# Patient Record
Sex: Male | Born: 1965
Health system: Southern US, Community
[De-identification: ages and names within clinical notes are randomized; demographics above are authoritative.]

## PROBLEM LIST (undated history)

## (undated) DIAGNOSIS — M25572 Pain in left ankle and joints of left foot: Secondary | ICD-10-CM

## (undated) DIAGNOSIS — M1711 Unilateral primary osteoarthritis, right knee: Secondary | ICD-10-CM

## (undated) DIAGNOSIS — G8929 Other chronic pain: Secondary | ICD-10-CM

## (undated) DIAGNOSIS — E7800 Pure hypercholesterolemia, unspecified: Secondary | ICD-10-CM

## (undated) DIAGNOSIS — I10 Essential (primary) hypertension: Secondary | ICD-10-CM

## (undated) DIAGNOSIS — M79672 Pain in left foot: Secondary | ICD-10-CM

## (undated) DIAGNOSIS — Z8042 Family history of malignant neoplasm of prostate: Secondary | ICD-10-CM

## (undated) HISTORY — DX: Pure hypercholesterolemia, unspecified: E78.00

## (undated) HISTORY — DX: Unilateral primary osteoarthritis, right knee: M17.11

## (undated) HISTORY — DX: Morbid (severe) obesity due to excess calories: E66.01

## (undated) HISTORY — DX: Pain in left ankle and joints of left foot: M25.572

## (undated) HISTORY — DX: Essential (primary) hypertension: I10

## (undated) HISTORY — DX: Pain in left foot: M79.672

## (undated) HISTORY — DX: Other chronic pain: G89.29

## (undated) HISTORY — DX: Family history of malignant neoplasm of prostate: Z80.42

---

## 2011-04-11 ENCOUNTER — Ambulatory Visit
Admission: RE | Admit: 2011-04-11 | Discharge: 2011-04-11 | Disposition: A | Discharge status: Home / Self Care | Payer: 59 | Source: Ambulatory Visit | Attending: Orthopedic Surgery | Admitting: Orthopedic Surgery

## 2011-04-11 ENCOUNTER — Other Ambulatory Visit: Payer: Self-pay | Admitting: Orthopedic Surgery

## 2011-04-11 DIAGNOSIS — M79672 Pain in left foot: Secondary | ICD-10-CM

## 2013-07-17 ENCOUNTER — Ambulatory Visit
Admission: RE | Admit: 2013-07-17 | Discharge: 2013-07-17 | Disposition: A | Discharge status: Home / Self Care | Payer: BC Managed Care – PPO | Source: Ambulatory Visit | Attending: Family Medicine | Admitting: Family Medicine

## 2013-07-17 ENCOUNTER — Other Ambulatory Visit: Payer: Self-pay | Admitting: Family Medicine

## 2013-07-17 DIAGNOSIS — M25579 Pain in unspecified ankle and joints of unspecified foot: Secondary | ICD-10-CM

## 2014-11-28 HISTORY — PX: COLONOSCOPY: SHX174

## 2015-05-28 HISTORY — PX: COLONOSCOPY: SHX174

## 2017-11-28 DIAGNOSIS — G8929 Other chronic pain: Secondary | ICD-10-CM

## 2017-11-28 HISTORY — DX: Other chronic pain: G89.29

## 2018-12-06 DIAGNOSIS — R030 Elevated blood-pressure reading, without diagnosis of hypertension: Secondary | ICD-10-CM | POA: Diagnosis not present

## 2018-12-06 DIAGNOSIS — R0602 Shortness of breath: Secondary | ICD-10-CM | POA: Diagnosis not present

## 2018-12-06 DIAGNOSIS — R635 Abnormal weight gain: Secondary | ICD-10-CM | POA: Diagnosis not present

## 2018-12-06 DIAGNOSIS — R5383 Other fatigue: Secondary | ICD-10-CM | POA: Diagnosis not present

## 2018-12-13 ENCOUNTER — Ambulatory Visit (INDEPENDENT_AMBULATORY_CARE_PROVIDER_SITE_OTHER): Payer: BLUE CROSS/BLUE SHIELD | Admitting: Family Medicine

## 2018-12-13 ENCOUNTER — Encounter: Payer: Self-pay | Admitting: Family Medicine

## 2018-12-13 VITALS — BP 147/85 | HR 78 | Temp 97.8°F | Resp 16 | Ht 71.0 in | Wt 317.0 lb

## 2018-12-13 DIAGNOSIS — M25561 Pain in right knee: Secondary | ICD-10-CM | POA: Diagnosis not present

## 2018-12-13 DIAGNOSIS — M79672 Pain in left foot: Secondary | ICD-10-CM | POA: Diagnosis not present

## 2018-12-13 DIAGNOSIS — M25572 Pain in left ankle and joints of left foot: Secondary | ICD-10-CM

## 2018-12-13 DIAGNOSIS — G8929 Other chronic pain: Secondary | ICD-10-CM

## 2018-12-13 MED ORDER — CELECOXIB 200 MG PO CAPS: 200.0000 mg | ORAL_CAPSULE | Freq: Two times a day (BID) | ORAL | 1 refills | Status: DC

## 2018-12-13 MED ORDER — PANTOPRAZOLE SODIUM 40 MG PO TBEC: 40.0000 mg | DELAYED_RELEASE_TABLET | Freq: Every day | ORAL | 1 refills | Status: DC

## 2018-12-13 NOTE — Patient Instructions (Signed)
Stop ibuprofen/advil (don't take any over the counter medication for pain).

## 2018-12-13 NOTE — Progress Notes (Signed)
Office Note 12/13/2018  CC:  Chief Complaint  Patient presents with  . Establish Care    Previous PCP [facility]  . Obesity    Pt is fasting   . Joint Pain    Right knee, left ankle     HPI:  Cristian Harris is a 53 y.o. male who is here to establish care and wants to discuss obesity as well as joint pain in R knee and left ankle. Patient's most recent primary MD: see above.  Last visit with them was about 4 yrs ago. Old records were not available for review prior to or during today's visit.  No CPE in 4 yrs or so.  Has pain in L foot on plantar surface from heel across the arch x "years".  Also, left ankle started hurting a few years ago as well, came on after his foot pain. Was walking a lot at the time.  R knee then began to hurt, pt suspicious that he was altering his gait to alleviate L ankle/foot pain/pressure.   Has had x-rays on L ankle and R knee in the past but he doesn't recall where and pt is not sure what they showed exactly.  No hx of any significant injury to knees, ankles, or feet. Has been taking ibup 600 mg multiple times a day for a couple years. All of the pain has prohibited any exercise and he is concerned about his significant wt gain over the last several years. Wants to get more active.   Past Medical History:  Diagnosis Date  . Borderline hyperlipidemia   . Family history of prostate cancer in father   . Morbid obesity (HCC)     Past Surgical History:  Procedure Laterality Date  . COLONOSCOPY  11/28/2014   [facility] per pt recall 5 years     Family History  Problem Relation Age of Onset  . Diabetes Mother   . Hyperlipidemia Mother   . Hypertension Mother   . Kidney disease Mother   . Arthritis Father   . Prostate cancer Father   . Thyroid disease Sister     Social History   Socioeconomic History  . Marital status: Legally Separated    Spouse name: Not on file  . Number of children: Not on file  . Years of education: Not  on file  . Highest education level: Not on file  Occupational History  . Not on file  Social Needs  . Financial resource strain: Not on file  . Food insecurity:    Worry: Not on file    Inability: Not on file  . Transportation needs:    Medical: Not on file    Non-medical: Not on file  Tobacco Use  . Smoking status: Light Tobacco Smoker    Types: Cigars  . Smokeless tobacco: Never Used  Substance and Sexual Activity  . Alcohol use: Yes    Comment: 1-2 monthly   . Drug use: Never  . Sexual activity: Not on file  Lifestyle  . Physical activity:    Days per week: Not on file    Minutes per session: Not on file  . Stress: Not on file  Relationships  . Social connections:    Talks on phone: Not on file    Gets together: Not on file    Attends religious service: Not on file    Active member of club or organization: Not on file    Attends meetings of clubs or organizations: Not  on file    Relationship status: Not on file  . Intimate partner violence:    Fear of current or ex partner: Not on file    Emotionally abused: Not on file    Physically abused: Not on file    Forced sexual activity: Not on file  Other Topics Concern  . Not on file  Social History Narrative   Married, 2 sons.   Educ: BS at [employer].   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   Tob:5 pack-yr hx, quit 1991.   Alc: rare.      Was in the [employer].  [place].   Got out 2002.    Outpatient Encounter Medications as of 12/13/2018  Medication Sig  . Cholecalciferol (DIALYVITE VITAMIN D 5000 PO) Take 2 capsules by mouth daily.  Marland Kitchen ibuprofen (ADVIL) 200 MG tablet Take 600 mg by mouth 2 (two) times daily.  . Multiple Vitamin (MULTIVITAMIN) tablet Take 1 tablet by mouth daily.  . celecoxib (CELEBREX) 200 MG capsule Take 1 capsule (200 mg total) by mouth 2 (two) times daily.  . pantoprazole (PROTONIX) 40 MG tablet Take 1 tablet (40 mg total) by mouth daily.   No facility-administered  encounter medications on file as of 12/13/2018.     No Known Allergies  ROS Review of Systems  Constitutional: Negative for fatigue and fever.  HENT: Negative for congestion and sore throat.   Eyes: Negative for visual disturbance.  Respiratory: Negative for cough and shortness of breath.   Cardiovascular: Negative for chest pain and leg swelling.  Gastrointestinal: Negative for abdominal pain, blood in stool and nausea.  Genitourinary: Negative for dysuria.  Musculoskeletal: Negative for back pain, joint swelling and myalgias.  Skin: Negative for rash.  Neurological: Negative for weakness and headaches.  Hematological: Negative for adenopathy.  Psychiatric/Behavioral: Negative for dysphoric mood. The patient is not nervous/anxious.     PE; Blood pressure (!) 147/85, pulse 78, temperature 97.8 F (36.6 C), temperature source Oral, resp. rate 16, height 5\' 11"  (1.803 m), weight (!) 317 lb (143.8 kg), SpO2 96 %. Body mass index is 44.21 kg/m.  Gen: Alert, well appearing.  Patient is oriented to person, place, time, and situation. AFFECT: pleasant, lucid thought and speech. Left ankle without erythema, swelling, or warmth.  ROM intact.  Mild TTP inferior to medial malleolus. No instability.  He has flat feet bilat, R>L.  He has mild tenderness to palpation on plantar aspect of left foot in proximal tarsometatarsal region.  No heel tenderness, no metatarsal head tenderness.  DP and PT pulses 1+ bilat. Right knee with no swelling or effusion.  No erythema or warmth.  Mild TTP along medial joint line when sitting up and knee at 90 degrees, but McMurray's elicits no joint line pain or tenderness. No instability with lachman's or posterior drawer or varus/valgus stressing. No tendonous swelling or tenderness.  ROM of R knee fully intact.  Pertinent labs:  None today.  ASSESSMENT AND PLAN:   New pt: will obtain some records from prior PCP as well as record of his colonoscopy.  1) Left  plantar foot pain of unclear etiology, with mild medial L ankle pain and R knee pain. Exam pretty unremarkable. I chose to have him stop ibuprofen, start celebrex 200 mg bid and pantoprazole 40mg  qd. Will obtain xrays of R knee, L ankle, and L foot. Refer to sports medicine for further evaluation.  An After Visit Summary was printed and given to  the patient.  Follow up: CPE in 1 month  Signed:  Santiago BumpersPhil Evvie Behrmann, MD           12/13/2018

## 2018-12-17 ENCOUNTER — Encounter: Payer: Self-pay | Admitting: *Deleted

## 2018-12-17 ENCOUNTER — Telehealth: Payer: Self-pay | Admitting: *Deleted

## 2018-12-17 MED ORDER — MELOXICAM 15 MG PO TABS: 15.0000 mg | ORAL_TABLET | Freq: Every day | ORAL | 3 refills | Status: DC

## 2018-12-17 NOTE — Telephone Encounter (Signed)
Received a fax from pts pharmacy requesting PA for Celecoxib.   Started PA but was asked it pt had tried and failed any other NSAIDS. Dr. Milinda Cave reviewed the list and decided to change Rx to meloxicam 15mg  take 1 tab daily #30 w/ 3RF.  Rx sent.   Left message for pt to call back.

## 2018-12-17 NOTE — Telephone Encounter (Signed)
Pt advised and voiced understanding.   

## 2018-12-20 DIAGNOSIS — R635 Abnormal weight gain: Secondary | ICD-10-CM | POA: Diagnosis not present

## 2018-12-20 DIAGNOSIS — M79672 Pain in left foot: Secondary | ICD-10-CM | POA: Diagnosis not present

## 2018-12-20 DIAGNOSIS — R030 Elevated blood-pressure reading, without diagnosis of hypertension: Secondary | ICD-10-CM | POA: Diagnosis not present

## 2018-12-24 ENCOUNTER — Ambulatory Visit (INDEPENDENT_AMBULATORY_CARE_PROVIDER_SITE_OTHER): Payer: BLUE CROSS/BLUE SHIELD | Admitting: Sports Medicine

## 2018-12-24 ENCOUNTER — Ambulatory Visit (INDEPENDENT_AMBULATORY_CARE_PROVIDER_SITE_OTHER): Payer: BLUE CROSS/BLUE SHIELD

## 2018-12-24 ENCOUNTER — Ambulatory Visit: Payer: Self-pay

## 2018-12-24 ENCOUNTER — Encounter: Payer: Self-pay | Admitting: Sports Medicine

## 2018-12-24 VITALS — BP 152/92 | HR 91 | Ht 71.0 in | Wt 309.0 lb

## 2018-12-24 DIAGNOSIS — G8929 Other chronic pain: Secondary | ICD-10-CM | POA: Diagnosis not present

## 2018-12-24 DIAGNOSIS — M25561 Pain in right knee: Secondary | ICD-10-CM | POA: Insufficient documentation

## 2018-12-24 DIAGNOSIS — M19072 Primary osteoarthritis, left ankle and foot: Secondary | ICD-10-CM | POA: Diagnosis not present

## 2018-12-24 DIAGNOSIS — M79672 Pain in left foot: Secondary | ICD-10-CM

## 2018-12-24 DIAGNOSIS — M1711 Unilateral primary osteoarthritis, right knee: Secondary | ICD-10-CM | POA: Diagnosis not present

## 2018-12-24 DIAGNOSIS — M25572 Pain in left ankle and joints of left foot: Secondary | ICD-10-CM

## 2018-12-24 NOTE — Progress Notes (Signed)
Cristian FellsMichael D. Delorise Shinerigby, DO  [facility] [phone]  [name] - 53 y.o. male MRN [id number]  Date of birth: 14-May-1966  Visit Date: December 30, 2018  PCP: [name], [name], MD   Referred by: [name], [name], MD  SUBJECTIVE:  Chief Complaint  Patient presents with  . New Patient (Initial Visit)    R knee, L ankle and L foot pain.  Referred by Dr. Milinda CaveMcGowen.  Celebrex 200 mg bid.  XRs of R knee, L ankle and L foot ordered but hasn't gotten.    HPI: Patient presents with multiple complaints today. His left foot and ankle have had significant issues for quite some time including an abnormal CT scan that showed significant cystic changes within the calcaneo navicular junction consistent with an abnormality within the sustentaculum tali.  He has had chronic ongoing issues with this left foot and ankle and reports swelling and discomfort.  It is worse with prolonged standing and walking.  It is helped with Advil cold water soaking and ice bottle to the foot.  He has undergone physical therapy for the foot in the past with only minimal improvement.  He is recently been prescribed meloxicam and this is provided some benefit.  His right knee has had symptoms that have been worsening over the past 1-1/2 to 2 years.  There is no prior injury to this.  He describes this as moderate to severe and does have occasional clicking and popping of the right knee but this is mild.  He has recently seen Dr. Milinda CaveMcGowen who is ordered x-rays that will be obtained today.  He was provided prescription for Celebrex but has not started on this.  REVIEW OF SYSTEMS: He denies any nighttime disturbances.  He did report having his left leg give out on him approximately 2 weeks ago but did not have any significant trauma from falling.  Otherwise 12 point review of systems performed and is negative   HISTORY:  Prior history reviewed and updated per electronic medical  record.  Social History   Occupational History  . Not on file  Tobacco Use  . Smoking status: Light Tobacco Smoker    Types: Cigars  . Smokeless tobacco: Never Used  Substance and Sexual Activity  . Alcohol use: Yes    Comment: 1-2 monthly   . Drug use: Never  . Sexual activity: Not on file   Social History   Social History Narrative   Married, 2 sons.   Educ: BS at [employer].   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   Tob:5 pack-yr hx, quit 1991.   Alc: rare.      Was in the [employer].  [place]   Got out 2002.   Past Medical History:  Diagnosis Date  . Borderline hyperlipidemia   . Chronic foot pain, left   . Family history of prostate cancer in father   . Morbid obesity (HCC)    Past Surgical History:  Procedure Laterality Date  . COLONOSCOPY  11/28/2014   [facility] per pt recall 5 years    family history includes Arthritis in his father; Diabetes in his mother; Hyperlipidemia in his mother; Hypertension in his mother; Kidney disease in his mother; Prostate cancer in his father; Thyroid disease in his sister.  DATA OBTAINED & REVIEWED:  No results for input(s): HGBA1C, LABURIC, CREATINE, CALCIUM, MG, AST, ALT, CKTOTAL, TSH in the last 8760 hours.  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 Exam is otherwise unremarkable.  XR L ankle/foot 12/24/2018 -sclerotic change within the subtalar joint correlating with the above findings.  Corticosteroid inj 12/24/2018.  Celebrex BID    Right Knee Pain   XR R knee 12/24/2018  Celebrex BID      OBJECTIVE:  VS:  HT:5\' 11"  (180.3 cm)   WT:(!) 309 lb (140.2 kg)  BMI:43.12    BP:(!) 152/92  HR:91bpm   TEMP: ( )  RESP:96 %   PHYSICAL EXAM: CONSTITUTIONAL: Well-developed, Well-nourished and In no acute distress EYES: Pupils are equal., EOM intact without nystagmus. and No scleral icterus. Psychiatric: Alert & appropriately interactive. and Not depressed or anxious appearing. EXTREMITY EXAM: Warm and well perfused  Right knee: Moderate pain with direct palpation over the medial and lateral patellar facets.  He is ligamentously stable.  Extensor mechanism strength intact.  He has weakness with hip abduction.  Good internal/external rotation of bilateral hips  Left ankle: He has pain with subtalar motion but good ankle dorsiflexion, plantarflexion, inversion and eversion strength and range of motion.  Marked pain with isolated foot abduction.  Negative metatarsal squeeze test.  No significant pain with palpation of the metatarsal heads.  ASSESSMENT  1. Chronic pain of left ankle   2. Morbid obesity (HCC)   3. Primary osteoarthritis of right knee   4. Arthritis of left subtalar joint   5. Chronic pain of right knee      PROCEDURES:  US Guided Injection per procedure note    PROCEDURE NOTE: THERAPEUTIC EXERCISES (97110) 15 minutes spent for Therapeutic exercises as below and as referenced in the AVS.  This included exercises focusing on stretching, strengthening, with significant focus on eccentric aspects.   Proper technique shown and discussed handout in great detail with myself.  All questions were discussed and answered.   Long term goals include an improvement in range of motion, strength, endurance as well as avoiding reinjury. Frequency of visits is one time as determined during today's  office visit. Frequency of exercises to be performed is as per handout.  EXERCISES REVIEWED: Hip ABduction strengthening with focus on Glute Medius Recruitment VMO Strengthening   PLAN:  Pertinent additional documentation may be included in corresponding procedure notes, imaging studies,  problem based documentation and patient instructions.  Left ankle pain Subtalar arthritis Should respond well to the injection performed today. Will absolutely benefit from custom cushioned insoles.  Right knee pain I would like for him to try the Celebrex and if any lack of improvement can consider intra-articular injection.   Home Therapeutic exercises prescribed today per procedure note.  Ideally nonsteroidal anti-inflammatories (NSAIDs) should not be taken on a chronic daily basis.  Appropriate use of these medications involves "bursting" full prescription strength dosing of the medication over a relatively short amount of time.  Ideally taking full doses of anti-inflammatories as discussed for 3 to 5 days at a time then stopping all NSAID use for as long as possible is the ideal way to reduce the risk of kidney/liver injury as well as GI bleeding.  Bursting a medication for up to 2 weeks to initiate then as needed for 3-5 days at a time (1-2 days after symptoms improve)  can help minimize this risk.  This was discussed in detail with the patient today  Activity modifications and the importance of avoiding exacerbating activities (limiting pain to no more than a 4 / 10 during or following activity) recommended and discussed.  Discussed red flag symptoms that warrant earlier emergent evaluation and patient voices understanding.  Return for custom orthotics at your convenience.          Andrena Mews, DO    [facility]

## 2018-12-24 NOTE — Patient Instructions (Addendum)
You had an injection today.  Things to be aware of after injection are listed below: . You may experience no significant improvement or even a slight worsening in your symptoms during the first 24 to 48 hours.  After that we expect your symptoms to improve gradually over the next 2 weeks for the medicine to have its maximal effect.  You should continue to have improvement out to 6 weeks after your injection. . Dr. Berline Choughigby recommends icing the site of the injection for 20 minutes  1-2 times the day of your injection . You may shower but no swimming, tub bath or Jacuzzi for 24 hours. . If your bandage falls off this does not need to be replaced.  It is appropriate to remove the bandage after 4 hours. . You may resume light activities as tolerated unless otherwise directed per Dr. Berline Choughigby during your visit  POSSIBLE STEROID SIDE EFFECTS:  Side effects from injectable steroids tend to be less than when taken orally however you may experience some of the symptoms listed below.  If experienced these should only last for a short period of time. Change in menstrual flow  Edema (swelling)  Increased appetite Skin flushing (redness)  Skin rash/acne  Thrush (oral) Yeast vaginitis    Increased sweating  Depression Increased blood glucose levels Cramping and leg/calf  Euphoria (feeling happy)  POSSIBLE PROCEDURE SIDE EFFECTS: The side effects of the injection are usually fairly minimal however if you may experience some of the following side effects that are usually self-limited and will is off on their own.  If you are concerned please feel free to call the office with questions:  Increased numbness or tingling  Nausea or vomiting  Swelling or bruising at the injection site   Please call our office if if you experience any of the following symptoms over the next week as these can be signs of infection:   Fever greater than 100.23F  Significant swelling at the injection site  Significant redness or drainage  from the injection site  If after 2 weeks you are continuing to have worsening symptoms please call our office to discuss what the next appropriate actions should be including the potential for a return office visit or other diagnostic testing.   I recommend you obtained a compression sleeve to help with your joint problems. There are many options on the market however I recommend obtaining a ankle Body Helix compression sleeve.  You can find information (including how to appropriate measure yourself for sizing) can be found at [email or web address].  Many of these products are health savings account (HSA) eligible.   You can use the compression sleeve at any time throughout the day but is most important to use while being active as well as for 2 hours post-activity.   It is appropriate to ice following activity with the compression sleeve in place.   The cost of the pair of custom orthotics is $195.  You can look into having your insurance company cover the cost of these. Some insurance companies cover the cost and other do not.  If they do not you will be responsible for the full cost of the orthotics.  I am happy to do these for you at any time, you just need to let our front office schedulers know you would like an "orthotic appointment."  Please also make sure you bring athletic shoes with you on the day of your orthotic appointment or whatever shoes you plan to wear your orthotics  in most frequently.   When you call your insurance company you will need to provide them the CPT code which is L3030 and there are 2 units.  You can call them  and ask if this is covered.     Please perform the exercise program that we have prepared for you and gone over in detail on a daily basis.  In addition to the handout you were provided you can access your program through: [email or web address]   Your unique program code is:  [email or web address]

## 2018-12-24 NOTE — Procedures (Signed)
PROCEDURE NOTE:  Ultrasound Guided: Injection: Left foot, subtalar joint Images were obtained and interpreted by myself, Gaspar Bidding, DO  Images have been saved and stored to PACS system. Images obtained on: GE S7 Ultrasound machine    ULTRASOUND FINDINGS:  Significant cystic change of the subtalar joint appreciated.  Small joint effusion.  DESCRIPTION OF PROCEDURE:  The patient's clinical condition is marked by substantial pain and/or significant functional disability. Other conservative therapy has not provided relief, is contraindicated, or not appropriate. There is a reasonable likelihood that injection will significantly improve the patient's pain and/or functional impairment.   After discussing the risks, benefits and expected outcomes of the injection and all questions were reviewed and answered, the patient wished to undergo the above named procedure.  Verbal consent was obtained.  The ultrasound was used to identify the target structure and adjacent neurovascular structures. The skin was then prepped in sterile fashion and the target structure was injected under direct visualization using sterile technique as below:  Single injection performed as below: PREP: Alcohol and Ethel Chloride APPROACH:medial direct, single injection, 25g 1.5 in. INJECTATE: 2 cc 0.5% Marcaine and 1 cc 40mg /mL DepoMedrol ASPIRATE: None DRESSING: Band-Aid and Full Ankle Body Helix  Post procedural instructions including recommending icing and warning signs for infection were reviewed.    This procedure was well tolerated and there were no complications.   IMPRESSION: Succesful Ultrasound Guided: Injection

## 2018-12-30 ENCOUNTER — Encounter: Payer: Self-pay | Admitting: Sports Medicine

## 2018-12-30 NOTE — Assessment & Plan Note (Signed)
Subtalar arthritis Should respond well to the injection performed today. Will absolutely benefit from custom cushioned insoles.

## 2018-12-30 NOTE — Assessment & Plan Note (Signed)
I would like for him to try the Celebrex and if any lack of improvement can consider intra-articular injection.

## 2019-01-11 ENCOUNTER — Ambulatory Visit (INDEPENDENT_AMBULATORY_CARE_PROVIDER_SITE_OTHER): Payer: BLUE CROSS/BLUE SHIELD | Admitting: Family Medicine

## 2019-01-11 ENCOUNTER — Encounter: Payer: Self-pay | Admitting: Family Medicine

## 2019-01-11 VITALS — BP 143/81 | HR 85 | Temp 98.3°F | Resp 16 | Ht 71.0 in | Wt 304.2 lb

## 2019-01-11 DIAGNOSIS — Z0000 Encounter for general adult medical examination without abnormal findings: Secondary | ICD-10-CM

## 2019-01-11 DIAGNOSIS — R030 Elevated blood-pressure reading, without diagnosis of hypertension: Secondary | ICD-10-CM

## 2019-01-11 DIAGNOSIS — Z125 Encounter for screening for malignant neoplasm of prostate: Secondary | ICD-10-CM

## 2019-01-11 DIAGNOSIS — Z23 Encounter for immunization: Secondary | ICD-10-CM

## 2019-01-11 LAB — CBC WITH DIFFERENTIAL/PLATELET
Basophils Absolute: 0 10*3/uL (ref 0.0–0.1)
Basophils Relative: 0.6 % (ref 0.0–3.0)
Eosinophils Absolute: 0.1 10*3/uL (ref 0.0–0.7)
Eosinophils Relative: 1.2 % (ref 0.0–5.0)
HCT: 41.2 % (ref 39.0–52.0)
Hemoglobin: 13.5 g/dL (ref 13.0–17.0)
Lymphocytes Relative: 37.7 % (ref 12.0–46.0)
Lymphs Abs: 2.6 10*3/uL (ref 0.7–4.0)
MCHC: 32.7 g/dL (ref 30.0–36.0)
MCV: 79 fl (ref 78.0–100.0)
Monocytes Absolute: 0.4 10*3/uL (ref 0.1–1.0)
Monocytes Relative: 5.2 % (ref 3.0–12.0)
Neutro Abs: 3.8 10*3/uL (ref 1.4–7.7)
Neutrophils Relative %: 55.3 % (ref 43.0–77.0)
Platelets: 244 10*3/uL (ref 150.0–400.0)
RBC: 5.21 Mil/uL (ref 4.22–5.81)
RDW: 16.4 % — ABNORMAL HIGH (ref 11.5–15.5)
WBC: 6.8 10*3/uL (ref 4.0–10.5)

## 2019-01-11 LAB — COMPREHENSIVE METABOLIC PANEL
ALT: 29 U/L (ref 0–53)
AST: 20 U/L (ref 0–37)
Albumin: 4.6 g/dL (ref 3.5–5.2)
Alkaline Phosphatase: 62 U/L (ref 39–117)
BUN: 9 mg/dL (ref 6–23)
CO2: 29 meq/L (ref 19–32)
Calcium: 9.7 mg/dL (ref 8.4–10.5)
Chloride: 102 mEq/L (ref 96–112)
Creatinine, Ser: 0.91 mg/dL (ref 0.40–1.50)
GFR: 104.79 mL/min (ref >60.00)
GLUCOSE: 98 mg/dL (ref 70–99)
Potassium: 4.1 mEq/L (ref 3.5–5.1)
SODIUM: 139 meq/L (ref 135–145)
Total Bilirubin: 1 mg/dL (ref 0.2–1.2)
Total Protein: 7.1 g/dL (ref 6.0–8.3)

## 2019-01-11 LAB — LIPID PANEL
CHOL/HDL RATIO: 4
Cholesterol: 197 mg/dL (ref 0–200)
HDL: 48.6 mg/dL (ref >39.00)
LDL Cholesterol: 130 mg/dL — ABNORMAL HIGH (ref 0–99)
NonHDL: 148.68
Triglycerides: 93 mg/dL (ref 0.0–149.0)
VLDL: 18.6 mg/dL (ref 0.0–40.0)

## 2019-01-11 LAB — PSA: PSA: 0.21 ng/mL (ref 0.10–4.00)

## 2019-01-11 LAB — TSH: TSH: 1.75 u[IU]/mL (ref 0.35–4.50)

## 2019-01-11 NOTE — Patient Instructions (Signed)

## 2019-01-11 NOTE — Progress Notes (Signed)
Office Note 01/11/2019  CC:  Chief Complaint  Patient presents with  . Annual Exam    Pt is fasting.     HPI:  Cristian Harris is a 53 y.o. Black male who is here for annual health maintenance exam.  Feeling fine, L ankle better since injection by Dr. Berline Chough.  He has had past hx of elevated bp in MD office, but has never checked bp outside of medical office.  Past Medical History:  Diagnosis Date  . Borderline hyperlipidemia   . Chronic foot pain, left   . Chronic pain of left ankle 2019   osteoarthritis->Dr. Berline Chough did steroid injection and is doing orthotics as of 12/2018-->improved.  . Family history of prostate cancer in father   . Morbid obesity (HCC)     Past Surgical History:  Procedure Laterality Date  . COLONOSCOPY  11/28/2014   [facility] per pt recall 5 years     Family History  Problem Relation Age of Onset  . Diabetes Mother   . Hyperlipidemia Mother   . Hypertension Mother   . Kidney disease Mother   . Arthritis Father   . Prostate cancer Father   . Thyroid disease Sister     Social History   Socioeconomic History  . Marital status: Legally Separated    Spouse name: Not on file  . Number of children: Not on file  . Years of education: Not on file  . Highest education level: Not on file  Occupational History  . Not on file  Social Needs  . Financial resource strain: Not on file  . Food insecurity:    Worry: Not on file    Inability: Not on file  . Transportation needs:    Medical: Not on file    Non-medical: Not on file  Tobacco Use  . Smoking status: Light Tobacco Smoker    Types: Cigars  . Smokeless tobacco: Never Used  Substance and Sexual Activity  . Alcohol use: Yes    Comment: 1-2 monthly   . Drug use: Never  . Sexual activity: Not on file  Lifestyle  . Physical activity:    Days per week: Not on file    Minutes per session: Not on file  . Stress: Not on file  Relationships  . Social connections:    Talks on phone: Not on  file    Gets together: Not on file    Attends religious service: Not on file    Active member of club or organization: Not on file    Attends meetings of clubs or organizations: Not on file    Relationship status: Not on file  . Intimate partner violence:    Fear of current or ex partner: Not on file    Emotionally abused: Not on file    Physically abused: Not on file    Forced sexual activity: Not on file  Other Topics Concern  . Not on file  Social History Narrative   Married, 2 sons.   Educ: BS at [employer].   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   Tob:5 pack-yr hx, quit 1991.   Alc: rare.      Was in the [employer].  Active in [place] war.   Got out 2002.    Outpatient Medications Prior to Visit  Medication Sig Dispense Refill  . BELVIQ XR 20 MG TB24 Take 1 tablet by mouth daily.    . Cholecalciferol (DIALYVITE VITAMIN D 5000 PO) Take 2  capsules by mouth daily.    . Glucosamine-Chondroitin (GLUCOSAMINE CHONDR COMPLEX PO) Take 1 capsule by mouth daily.    . meloxicam (MOBIC) 15 MG tablet Take 1 tablet (15 mg total) by mouth daily. 30 tablet 3  . Multiple Vitamin (MULTIVITAMIN) tablet Take 1 tablet by mouth daily.    . pantoprazole (PROTONIX) 40 MG tablet Take 1 tablet (40 mg total) by mouth daily. 30 tablet 1  . ibuprofen (ADVIL) 200 MG tablet Take 600 mg by mouth 2 (two) times daily.     No facility-administered medications prior to visit.     No Known Allergies  ROS Review of Systems  Constitutional: Negative for appetite change, chills, fatigue and fever.  HENT: Negative for congestion, dental problem, ear pain and sore throat.   Eyes: Negative for discharge, redness and visual disturbance.  Respiratory: Negative for cough, chest tightness, shortness of breath and wheezing.   Cardiovascular: Negative for chest pain, palpitations and leg swelling.  Gastrointestinal: Negative for abdominal pain, blood in stool, diarrhea, nausea and vomiting.  Genitourinary:  Negative for difficulty urinating, dysuria, flank pain, frequency, hematuria and urgency.  Musculoskeletal: Negative for arthralgias, back pain, joint swelling, myalgias and neck stiffness.  Skin: Negative for pallor and rash.  Neurological: Negative for dizziness, speech difficulty, weakness and headaches.  Hematological: Negative for adenopathy. Does not bruise/bleed easily.  Psychiatric/Behavioral: Negative for confusion and sleep disturbance. The patient is not nervous/anxious.     PE; Blood pressure (!) 143/81, pulse 85, temperature 98.3 F (36.8 C), temperature source Oral, resp. rate 16, height 5\' 11"  (1.803 m), weight (!) 304 lb 4 oz (138 kg), SpO2 95 %. Body mass index is 42.43 kg/m.  Gen: Alert, well appearing.  Patient is oriented to person, place, time, and situation. AFFECT: pleasant, lucid thought and speech. ENT: Ears: EACs clear, normal epithelium.  TMs with good light reflex and landmarks bilaterally.  Eyes: no injection, icteris, swelling, or exudate.  EOMI, PERRLA. Nose: no drainage or turbinate edema/swelling.  No injection or focal lesion.  Mouth: lips without lesion/swelling.  Oral mucosa pink and moist.  Dentition intact and without obvious caries or gingival swelling.  Oropharynx without erythema, exudate, or swelling.  Neck: supple/nontender.  No LAD, mass, or TM.  Carotid pulses 2+ bilaterally, without bruits. CV: RRR, no m/r/g.   LUNGS: CTA bilat, nonlabored resps, good aeration in all lung fields. ABD: soft, NT, ND, BS normal.  No hepatospenomegaly or mass.  No bruits. EXT: no clubbing, cyanosis, or edema.  Musculoskeletal: no joint swelling, erythema, warmth, or tenderness.  ROM of all joints intact. Skin - no sores or suspicious lesions or rashes or color changes Rectal exam: negative without mass, lesions or tenderness, PROSTATE EXAM: smooth and symmetric without nodules or tenderness.   Pertinent labs:  NONE  ASSESSMENT AND PLAN:   1) Elevated blood  pressure w/out dx of HTN: discussed home monitoring of bp., gave home monitoring handout.  2) Health maintenance exam: Reviewed age and gender appropriate health maintenance issues (prudent diet, regular exercise, health risks of tobacco and excessive alcohol, use of seatbelts, fire alarms in home, use of sunscreen).  Also reviewed age and gender appropriate health screening as well as vaccine recommendations. Vaccines: Tdap and flu vaccines today.  Shingrix discussed-->he'll get this at a follow up visit. Labs: fasting [place] + PSA. Prostate ca screening: DRE normal today, PSA. Colon ca screening: recall 11/2019 [facility]).  An After Visit Summary was printed and given to the patient.  FOLLOW  UP:  Return in about 2 weeks (around 01/25/2019) for f/u elevated blood pressure. And get shingrix #1.  Signed:  Santiago BumpersPhil McGowen, MD           01/11/2019

## 2019-01-13 ENCOUNTER — Encounter: Payer: Self-pay | Admitting: Family Medicine

## 2019-01-14 ENCOUNTER — Encounter: Payer: Self-pay | Admitting: Family Medicine

## 2019-01-14 ENCOUNTER — Other Ambulatory Visit: Payer: Self-pay | Admitting: *Deleted

## 2019-01-14 DIAGNOSIS — E7800 Pure hypercholesterolemia, unspecified: Secondary | ICD-10-CM

## 2019-01-14 MED ORDER — ATORVASTATIN CALCIUM 20 MG PO TABS: 20.0000 mg | ORAL_TABLET | Freq: Every day | ORAL | 2 refills | Status: DC

## 2019-01-15 ENCOUNTER — Ambulatory Visit: Payer: BLUE CROSS/BLUE SHIELD | Admitting: Sports Medicine

## 2019-01-21 DIAGNOSIS — F418 Other specified anxiety disorders: Secondary | ICD-10-CM | POA: Diagnosis not present

## 2019-01-21 DIAGNOSIS — R635 Abnormal weight gain: Secondary | ICD-10-CM | POA: Diagnosis not present

## 2019-01-24 ENCOUNTER — Ambulatory Visit (INDEPENDENT_AMBULATORY_CARE_PROVIDER_SITE_OTHER): Payer: BLUE CROSS/BLUE SHIELD | Admitting: Family Medicine

## 2019-01-24 ENCOUNTER — Encounter: Payer: Self-pay | Admitting: Family Medicine

## 2019-01-24 VITALS — BP 117/77 | HR 90 | Temp 97.4°F | Resp 16 | Ht 71.0 in | Wt 302.2 lb

## 2019-01-24 DIAGNOSIS — E7800 Pure hypercholesterolemia, unspecified: Secondary | ICD-10-CM | POA: Diagnosis not present

## 2019-01-24 DIAGNOSIS — R030 Elevated blood-pressure reading, without diagnosis of hypertension: Secondary | ICD-10-CM | POA: Diagnosis not present

## 2019-01-24 NOTE — Progress Notes (Signed)
OFFICE VISIT  01/24/2019   CC:  Chief Complaint  Patient presents with  . Follow-up    hypertension   HPI:    Patient is a 53 y.o. African-American male who presents for 2 week f/u elevated bp w/out dx HTN. Plan was for him to do some home bp monitoring and come in to review these today.  He is currently on no antihypertensives.  Some home bp's checked: typically 130s over 80. He is starting to exercise: walking some now, is joining gym today. Trying to cut back on late night eating and simple sugars intake.  He started atorvastatin recently.  No side effects.  He is rx'd belviq by [facility] loss clinic in [place].  This med has been taken off the market and he has stopped taking it.  He has no acute complaints.  Past Medical History:  Diagnosis Date  . Chronic foot pain, left   . 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 Berline Chough did steroid injection and is doing orthotics as of 12/2018-->improved.  . Family history of prostate cancer in father   . Hypercholesterolemia    rec'd atorva 20mg  qd 12/2018  . Morbid obesity (HCC)   . Primary osteoarthritis of right knee     Past Surgical History:  Procedure Laterality Date  . COLONOSCOPY  11/28/2014   [facility] per pt recall 5 years     Outpatient Medications Prior to Visit  Medication Sig Dispense Refill  . atorvastatin (LIPITOR) 20 MG tablet Take 1 tablet (20 mg total) by mouth daily. 30 tablet 2  . BELVIQ XR 20 MG TB24 Take 1 tablet by mouth daily.    . Cholecalciferol (DIALYVITE VITAMIN D 5000 PO) Take 2 capsules by mouth daily.    . Glucosamine-Chondroitin (GLUCOSAMINE CHONDR COMPLEX PO) Take 1 capsule by mouth daily.    . meloxicam (MOBIC) 15 MG tablet Take 1 tablet (15 mg total) by mouth daily. 30 tablet 3  . Multiple Vitamin (MULTIVITAMIN) tablet Take 1 tablet by mouth daily.    . pantoprazole (PROTONIX) 40 MG tablet Take 1 tablet (40 mg total) by mouth daily. 30  tablet 1   No facility-administered medications prior to visit.     No Known Allergies  ROS As per HPI  PE: Blood pressure 117/77, pulse 90, temperature (!) 97.4 F (36.3 C), temperature source Oral, resp. rate 16, height 5\' 11"  (1.803 m), weight (!) 302 lb 3.2 oz (137.1 kg), SpO2 95 %. Body mass index is 42.15 kg/m.  Gen: Alert, well appearing.  Patient is oriented to person, place, time, and situation. AFFECT: pleasant, lucid thought and speech. No further exam today.  LABS:    Chemistry      Component Value Date/Time   NA 139 01/11/2019 1056   K 4.1 01/11/2019 1056   CL 102 01/11/2019 1056   CO2 29 01/11/2019 1056   BUN 9 01/11/2019 1056   CREATININE 0.91 01/11/2019 1056      Component Value Date/Time   CALCIUM 9.7 01/11/2019 1056   ALKPHOS 62 01/11/2019 1056   AST 20 01/11/2019 1056   ALT 29 01/11/2019 1056   BILITOT 1.0 01/11/2019 1056      IMPRESSION AND PLAN:  1) Elevated bp w/out dx htn: home bps borderline.  Normal here today. Plan is to get more aggressive with TLC, low sodium diet, wt loss. Monitor bp at home at least once a week. Goal is <130/80.  2)  HLD: tolerating recent start of atorvastatin. Plan for recheck FLP is in place.  An After Visit Summary was printed and given to the patient.  FOLLOW UP: Return in about 6 months (around 07/25/2019) for routine chronic illness f/u (elev bp w/out dx htn).  Signed:  Santiago Bumpers, MD           01/24/2019

## 2019-02-21 ENCOUNTER — Ambulatory Visit: Payer: BLUE CROSS/BLUE SHIELD | Admitting: Sports Medicine

## 2019-03-01 DIAGNOSIS — R635 Abnormal weight gain: Secondary | ICD-10-CM | POA: Diagnosis not present

## 2019-03-01 DIAGNOSIS — Z79899 Other long term (current) drug therapy: Secondary | ICD-10-CM | POA: Diagnosis not present

## 2019-04-23 ENCOUNTER — Other Ambulatory Visit: Payer: Self-pay | Admitting: Family Medicine

## 2019-07-09 ENCOUNTER — Other Ambulatory Visit: Payer: Self-pay | Admitting: Family Medicine

## 2019-07-09 NOTE — Telephone Encounter (Signed)
RF request for Meloxicam LOV: 01/24/19 Next ov: advised to f/u 6 mo for RCI Last written: 12/17/18 (30,3)  Please advise, thanks. Medication pending

## 2019-07-09 NOTE — Telephone Encounter (Signed)
Patient requesting meloxicam to [facility].

## 2019-07-10 MED ORDER — MELOXICAM 15 MG PO TABS: 15.0000 mg | ORAL_TABLET | Freq: Every day | ORAL | 3 refills | Status: DC

## 2019-07-25 ENCOUNTER — Ambulatory Visit: Payer: BLUE CROSS/BLUE SHIELD | Admitting: Family Medicine

## 2019-10-04 ENCOUNTER — Ambulatory Visit: Payer: BLUE CROSS/BLUE SHIELD | Admitting: Family Medicine

## 2019-11-14 ENCOUNTER — Other Ambulatory Visit: Payer: Self-pay | Admitting: Family Medicine

## 2019-11-14 NOTE — Telephone Encounter (Signed)
RF request for Meloxicam LOV:01/24/19 Next ov: 12/06/19 Last written:07/15/19 (30,3)  Please Advise. Medication pending

## 2019-12-06 ENCOUNTER — Ambulatory Visit: Payer: BC Managed Care – PPO | Admitting: Family Medicine

## 2020-02-20 ENCOUNTER — Ambulatory Visit: Payer: BC Managed Care – PPO | Attending: Family

## 2020-02-20 DIAGNOSIS — Z23 Encounter for immunization: Secondary | ICD-10-CM

## 2020-02-20 NOTE — Progress Notes (Signed)
   Covid-19 Vaccination Clinic  Name:  Cristian Harris    MRN: [id number] DOB: 09/14/1966  02/20/2020  [name] was observed post Covid-19 immunization for 15 minutes without incident. He was provided with Vaccine Information Sheet and instruction to access the V-Safe system.   [name] was instructed to call 911 with any severe reactions post vaccine: Marland Kitchen Difficulty breathing  . Swelling of face and throat  . A fast heartbeat  . A bad rash all over body  . Dizziness and weakness   Immunizations Administered    Name Date Dose VIS Date Route   Moderna COVID-19 Vaccine 02/20/2020 12:58 PM 0.5 mL 10/29/2019 Intramuscular   Manufacturer: Moderna   Lot: [id number]   NDC: 69507-225-75

## 2020-03-05 ENCOUNTER — Ambulatory Visit: Payer: BC Managed Care – PPO | Admitting: Family Medicine

## 2020-03-24 ENCOUNTER — Ambulatory Visit: Payer: BC Managed Care – PPO | Attending: Family

## 2020-03-24 DIAGNOSIS — Z23 Encounter for immunization: Secondary | ICD-10-CM

## 2020-03-24 NOTE — Progress Notes (Signed)
   Covid-19 Vaccination Clinic  Name:  Cristian Harris    MRN: [id number] DOB: June 17, 1966  03/24/2020  [name] was observed post Covid-19 immunization for 15 minutes without incident. He was provided with Vaccine Information Sheet and instruction to access the V-Safe system.   [name] was instructed to call 911 with any severe reactions post vaccine: Marland Kitchen Difficulty breathing  . Swelling of face and throat  . A fast heartbeat  . A bad rash all over body  . Dizziness and weakness   Immunizations Administered    Name Date Dose VIS Date Route   Moderna COVID-19 Vaccine 03/24/2020 11:00 AM 0.5 mL 10/2019 Intramuscular   Manufacturer: Moderna   Lot: [id number]   NDC: 75102-585-27

## 2020-05-07 ENCOUNTER — Ambulatory Visit: Payer: BC Managed Care – PPO | Admitting: Family Medicine

## 2020-05-09 ENCOUNTER — Other Ambulatory Visit: Payer: Self-pay | Admitting: Family Medicine

## 2020-05-28 DIAGNOSIS — R7303 Prediabetes: Secondary | ICD-10-CM

## 2020-05-28 HISTORY — DX: Prediabetes: R73.03

## 2020-06-12 ENCOUNTER — Encounter: Payer: Self-pay | Admitting: Family Medicine

## 2020-06-12 ENCOUNTER — Other Ambulatory Visit: Payer: Self-pay

## 2020-06-12 ENCOUNTER — Ambulatory Visit (INDEPENDENT_AMBULATORY_CARE_PROVIDER_SITE_OTHER): Payer: BC Managed Care – PPO | Admitting: Family Medicine

## 2020-06-12 VITALS — BP 136/87 | HR 75 | Temp 98.1°F | Resp 16 | Ht 71.0 in | Wt 313.4 lb

## 2020-06-12 DIAGNOSIS — E7800 Pure hypercholesterolemia, unspecified: Secondary | ICD-10-CM | POA: Diagnosis not present

## 2020-06-12 DIAGNOSIS — R030 Elevated blood-pressure reading, without diagnosis of hypertension: Secondary | ICD-10-CM

## 2020-06-12 LAB — LIPID PANEL
Cholesterol: 242 mg/dL — ABNORMAL HIGH (ref 0–200)
HDL: 49.4 mg/dL (ref >39.00)
LDL Cholesterol: 174 mg/dL — ABNORMAL HIGH (ref 0–99)
NonHDL: 192.94
Total CHOL/HDL Ratio: 5
Triglycerides: 96 mg/dL (ref 0.0–149.0)
VLDL: 19.2 mg/dL (ref 0.0–40.0)

## 2020-06-12 LAB — COMPREHENSIVE METABOLIC PANEL
ALT: 19 U/L (ref 0–53)
AST: 17 U/L (ref 0–37)
Albumin: 4.5 g/dL (ref 3.5–5.2)
Alkaline Phosphatase: 63 U/L (ref 39–117)
BUN: 19 mg/dL (ref 6–23)
CO2: 28 mEq/L (ref 19–32)
Calcium: 9.8 mg/dL (ref 8.4–10.5)
Chloride: 103 mEq/L (ref 96–112)
Creatinine, Ser: 1.01 mg/dL (ref 0.40–1.50)
GFR: 92.43 mL/min (ref >60.00)
Glucose, Bld: 112 mg/dL — ABNORMAL HIGH (ref 70–99)
Potassium: 4.2 mEq/L (ref 3.5–5.1)
Sodium: 139 mEq/L (ref 135–145)
Total Bilirubin: 0.8 mg/dL (ref 0.2–1.2)
Total Protein: 7 g/dL (ref 6.0–8.3)

## 2020-06-12 NOTE — Progress Notes (Addendum)
OFFICE VISIT  06/12/2020   CC:  Chief Complaint  Patient presents with  . Follow-up    RCI, pt is fasting   HPI:    Patient is a 54 y.o. African-American male who presents for f/u hypercholesterolemia, morb obesity, and elevated bp w/out dx of HTN. I last saw him about 1 and 1/2 years ago. A/P as of last visit: "1) Elevated bp w/out dx htn: home bps borderline.  Normal here today. Plan is to get more aggressive with TLC, low sodium diet, wt loss. Monitor bp at home at least once a week. Goal is <130/80.  2) HLD: tolerating recent start of atorvastatin. Plan for recheck FLP is in place"  INTERIM HX: Not dieting or exercising. Not taking statin "I don't even remember that medication!" No home bp monitoring. Working from home full time, sometimes doesn't leave the house for a week at a time.  ROS: no fevers, no CP, no SOB, no wheezing, no cough, no dizziness, no HAs, no rashes, no melena/hematochezia.  No polyuria or polydipsia.  No myalgias or arthralgias.  No focal weakness, paresthesias, or tremors.  No acute vision or hearing abnormalities. No n/v/d or abd pain.  No palpitations.     Past Medical History:  Diagnosis Date  . Chronic foot pain, left   . 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 Berline Chough did steroid injection and is doing orthotics as of 12/2018-->improved.  . Family history of prostate cancer in father   . Hypercholesterolemia    rec'd atorva 20mg  qd 12/2018  . Morbid obesity (HCC)   . Primary osteoarthritis of right knee     Past Surgical History:  Procedure Laterality Date  . COLONOSCOPY  11/28/2014   [facility] per pt recall 5 years     Outpatient Medications Prior to Visit  Medication Sig Dispense Refill  . Cholecalciferol (DIALYVITE VITAMIN D 5000 PO) Take 2 capsules by mouth daily.    . Glucosamine-Chondroitin (GLUCOSAMINE CHONDR COMPLEX PO) Take 1 capsule by mouth daily.    . meloxicam  (MOBIC) 15 MG tablet TAKE 1 TABLET BY MOUTH EVERY DAY 30 tablet 3  . Multiple Vitamin (MULTIVITAMIN) tablet Take 1 tablet by mouth daily.    . Turmeric (QC TUMERIC COMPLEX PO) Take by mouth daily.    01/27/2015 atorvastatin (LIPITOR) 20 MG tablet TAKE 1 TABLET BY MOUTH EVERY DAY (Patient not taking: Reported on 06/12/2020) 30 tablet 2  . BELVIQ XR 20 MG TB24 Take 1 tablet by mouth daily. (Patient not taking: Reported on 06/12/2020)    . pantoprazole (PROTONIX) 40 MG tablet Take 1 tablet (40 mg total) by mouth daily. (Patient not taking: Reported on 06/12/2020) 30 tablet 1   No facility-administered medications prior to visit.    No Known Allergies  ROS As per HPI  PE: Vitals with BMI 06/12/2020 01/24/2019 01/11/2019  Height 5\' 11"  5\' 11"  5\' 11"   Weight 313 lbs 6 oz 302 lbs 3 oz 304 lbs 4 oz  BMI 43.73 42.17 42.45  Systolic 136 117 01/13/2019  Diastolic 87 77 81  Pulse 75 90 85  O2 sat on RA today 92%  Gen: Alert, well appearing.  Patient is oriented to person, place, time, and situation. AFFECT: pleasant, lucid thought and speech. CV: RRR, no m/r/g.   LUNGS: CTA bilat, nonlabored resps, good aeration in all lung fields. EXT: no clubbing or cyanosis.  no edema.    LABS:  Lab Results  Component Value Date   TSH 1.75 01/11/2019   Lab Results  Component Value Date   WBC 6.8 01/11/2019   HGB 13.5 01/11/2019   HCT 41.2 01/11/2019   MCV 79.0 01/11/2019   PLT 244.0 01/11/2019   Lab Results  Component Value Date   CREATININE 0.91 01/11/2019   BUN 9 01/11/2019   NA 139 01/11/2019   K 4.1 01/11/2019   CL 102 01/11/2019   CO2 29 01/11/2019   Lab Results  Component Value Date   ALT 29 01/11/2019   AST 20 01/11/2019   ALKPHOS 62 01/11/2019   BILITOT 1.0 01/11/2019   Lab Results  Component Value Date   CHOL 197 01/11/2019   Lab Results  Component Value Date   HDL 48.60 01/11/2019   Lab Results  Component Value Date   LDLCALC 130 (H) 01/11/2019   Lab Results  Component Value Date    TRIG 93.0 01/11/2019   Lab Results  Component Value Date   CHOLHDL 4 01/11/2019   Lab Results  Component Value Date   PSA 0.21 01/11/2019    IMPRESSION AND PLAN:  1) Hypercholesterolemia: noncompliant with medication. Recheck FLP and hepatic panel today in prep for restart of statin. No med sent in yet.  2) Elevated bp w/out dx of HTN (although I suspect he does have true HTN): will go ahead and get some home bp monitoring at this time before starting med.   Instructions: Buy a blood pressure machine at any pharmacy (upper arm cuff preferred over a wrist cuff). Check blood pressure and heart rate at least 3 times per week and write the numbers down and bring in for review with me in 6 wks. Goal (normal) blood pressure is <130 on top and <80 on bottom.  3) Morbid obesity: wt is up 11 lbs since I last saw him. Sedentary, not eating healthy. Discussed basics of TLC's that we're looking for, start slow and work your way up.  LABS today: CMET and FLP.  An After Visit Summary was printed and given to the patient.  FOLLOW UP: 6 wks in office  Signed:  Santiago Bumpers, MD           06/12/2020

## 2020-06-12 NOTE — Patient Instructions (Signed)
Buy a blood pressure machine at any pharmacy (upper arm cuff preferred over a wrist cuff).  Check blood pressure and heart rate at least 3 times per week and write the numbers down and bring in for review with me in 6 wks. Goal (normal) blood pressure is <130 on top and <80 on bottom.

## 2020-06-15 ENCOUNTER — Encounter: Payer: Self-pay | Admitting: Family Medicine

## 2020-07-27 ENCOUNTER — Encounter: Payer: BC Managed Care – PPO | Admitting: Family Medicine

## 2020-10-02 ENCOUNTER — Encounter: Payer: BC Managed Care – PPO | Admitting: Family Medicine

## 2020-11-08 ENCOUNTER — Other Ambulatory Visit: Payer: Self-pay | Admitting: Family Medicine

## 2020-11-17 ENCOUNTER — Encounter: Payer: BC Managed Care – PPO | Admitting: Family Medicine

## 2020-12-14 ENCOUNTER — Encounter: Payer: BC Managed Care – PPO | Admitting: Family Medicine

## 2021-01-04 ENCOUNTER — Other Ambulatory Visit: Payer: Self-pay

## 2021-01-04 ENCOUNTER — Encounter: Payer: Self-pay | Admitting: Family Medicine

## 2021-01-04 ENCOUNTER — Ambulatory Visit (INDEPENDENT_AMBULATORY_CARE_PROVIDER_SITE_OTHER): Payer: BC Managed Care – PPO | Admitting: Family Medicine

## 2021-01-04 VITALS — BP 162/84 | HR 85 | Temp 97.8°F | Resp 18 | Ht 70.6 in | Wt 321.8 lb

## 2021-01-04 DIAGNOSIS — Z125 Encounter for screening for malignant neoplasm of prostate: Secondary | ICD-10-CM | POA: Diagnosis not present

## 2021-01-04 DIAGNOSIS — I10 Essential (primary) hypertension: Secondary | ICD-10-CM

## 2021-01-04 DIAGNOSIS — Z23 Encounter for immunization: Secondary | ICD-10-CM | POA: Diagnosis not present

## 2021-01-04 DIAGNOSIS — Z0000 Encounter for general adult medical examination without abnormal findings: Secondary | ICD-10-CM | POA: Diagnosis not present

## 2021-01-04 DIAGNOSIS — E7800 Pure hypercholesterolemia, unspecified: Secondary | ICD-10-CM | POA: Diagnosis not present

## 2021-01-04 DIAGNOSIS — R7301 Impaired fasting glucose: Secondary | ICD-10-CM

## 2021-01-04 LAB — COMPREHENSIVE METABOLIC PANEL
ALT: 20 U/L (ref 0–53)
AST: 16 U/L (ref 0–37)
Albumin: 4.2 g/dL (ref 3.5–5.2)
Alkaline Phosphatase: 59 U/L (ref 39–117)
BUN: 13 mg/dL (ref 6–23)
CO2: 27 mEq/L (ref 19–32)
Calcium: 9.6 mg/dL (ref 8.4–10.5)
Chloride: 105 mEq/L (ref 96–112)
Creatinine, Ser: 0.93 mg/dL (ref 0.40–1.50)
GFR: 91.69 mL/min (ref >60.00)
Glucose, Bld: 103 mg/dL — ABNORMAL HIGH (ref 70–99)
Potassium: 4.1 mEq/L (ref 3.5–5.1)
Sodium: 139 mEq/L (ref 135–145)
Total Bilirubin: 0.8 mg/dL (ref 0.2–1.2)
Total Protein: 6.7 g/dL (ref 6.0–8.3)

## 2021-01-04 LAB — CBC WITH DIFFERENTIAL/PLATELET
Basophils Absolute: 0 10*3/uL (ref 0.0–0.1)
Basophils Relative: 0.5 % (ref 0.0–3.0)
Eosinophils Absolute: 0.1 10*3/uL (ref 0.0–0.7)
Eosinophils Relative: 1.6 % (ref 0.0–5.0)
HCT: 40 % (ref 39.0–52.0)
Hemoglobin: 13.3 g/dL (ref 13.0–17.0)
Lymphocytes Relative: 44.8 % (ref 12.0–46.0)
Lymphs Abs: 3.1 10*3/uL (ref 0.7–4.0)
MCHC: 33.3 g/dL (ref 30.0–36.0)
MCV: 80.5 fl (ref 78.0–100.0)
Monocytes Absolute: 0.3 10*3/uL (ref 0.1–1.0)
Monocytes Relative: 4.9 % (ref 3.0–12.0)
Neutro Abs: 3.3 10*3/uL (ref 1.4–7.7)
Neutrophils Relative %: 48.2 % (ref 43.0–77.0)
Platelets: 196 10*3/uL (ref 150.0–400.0)
RBC: 4.97 Mil/uL (ref 4.22–5.81)
RDW: 15.1 % (ref 11.5–15.5)
WBC: 6.9 10*3/uL (ref 4.0–10.5)

## 2021-01-04 LAB — LIPID PANEL
Cholesterol: 208 mg/dL — ABNORMAL HIGH (ref 0–200)
HDL: 48.1 mg/dL (ref >39.00)
LDL Cholesterol: 139 mg/dL — ABNORMAL HIGH (ref 0–99)
NonHDL: 160.38
Total CHOL/HDL Ratio: 4
Triglycerides: 109 mg/dL (ref 0.0–149.0)
VLDL: 21.8 mg/dL (ref 0.0–40.0)

## 2021-01-04 LAB — HEMOGLOBIN A1C: Hgb A1c MFr Bld: 6 % (ref 4.6–6.5)

## 2021-01-04 LAB — PSA: PSA: 0.31 ng/mL (ref 0.10–4.00)

## 2021-01-04 LAB — TSH: TSH: 1.35 u[IU]/mL (ref 0.35–4.50)

## 2021-01-04 MED ORDER — ATORVASTATIN CALCIUM 20 MG PO TABS: 20.0000 mg | ORAL_TABLET | Freq: Every day | ORAL | 2 refills | Status: DC

## 2021-01-04 MED ORDER — IRBESARTAN 150 MG PO TABS: 150.0000 mg | ORAL_TABLET | Freq: Every day | ORAL | 0 refills | Status: DC

## 2021-01-04 NOTE — Progress Notes (Signed)
Office Note 01/04/2021  CC:  Chief Complaint  Patient presents with  . Annual Exam    Left foot and ankle pain Concerns about weight.  Flu shot today No recent colonoscopy   HPI:  Cristian Harris is a 55 y.o. Black male who is here for annual health maintenance exam and 6 mo f/u elev bp w/out dx htn, HLD, IFG. A/P as of last visit: "1) Hypercholesterolemia: noncompliant with medication. Recheck FLP and hepatic panel today in prep for restart of statin. No med sent in yet.  2) Elevated bp w/out dx of HTN (although I suspect he does have true HTN): will go ahead and get some home bp monitoring at this time before starting med.   Instructions: Buy a blood pressure machine at any pharmacy (upper arm cuff preferred over a wrist cuff). Check blood pressure and heart rate at least 3 times per week and write the numbers down and bring in for review with me in 6 wks. Goal (normal) blood pressure is <130 on top and <80 on bottom.  3) Morbid obesity: wt is up 11 lbs since I last saw him. Sedentary, not eating healthy. Discussed basics of TLC's that we're looking for, start slow and work your way up."  INTERIM HX: Labs last visit showed signif elev LDL again and mild elev fasting glucose (112).  Could not get in contact with pt in order to give rec's (start statin and f/u labs 6 wks).  Unable to exercise, has gradually worsening chronic L ankle/foot pain. Meloxicam daily does help but still signif imp activity level and quality of life.  No home bp monitoring. Not dieting.  Past Medical History:  Diagnosis Date  . Chronic foot pain, left   . 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 Berline Chough did steroid injection and is doing orthotics as of 12/2018-->improved.  . Family history of prostate cancer in father   . Hypercholesterolemia    rec'd atorva 20mg  qd 12/2018 and 05/2020  . IFG (impaired fasting glucose) 05/2020  . Morbid  obesity (HCC)   . Primary osteoarthritis of right knee     Past Surgical History:  Procedure Laterality Date  . COLONOSCOPY  11/28/2014   [facility] per pt recall 5 years     Family History  Problem Relation Age of Onset  . Diabetes Mother   . Hyperlipidemia Mother   . Hypertension Mother   . Kidney disease Mother   . Arthritis Father   . Prostate cancer Father   . Thyroid disease Sister     Social History   Socioeconomic History  . Marital status: Legally Separated    Spouse name: Not on file  . Number of children: Not on file  . Years of education: Not on file  . Highest education level: Not on file  Occupational History  . Not on file  Tobacco Use  . Smoking status: Light Tobacco Smoker    Types: Cigars  . Smokeless tobacco: Never Used  Vaping Use  . Vaping Use: Never used  Substance and Sexual Activity  . Alcohol use: Yes    Comment: 1-2 monthly   . Drug use: Never  . Sexual activity: Not on file  Other Topics Concern  . Not on file  Social History Narrative   Married, 2 sons.   Educ: BS at [employer].   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   Tob:5 pack-yr hx, quit 1991.  Alc: rare.      Was in the [employer].  Active in [place] war.   Got out 2002.   Social Determinants of Health   Financial Resource Strain: Not on file  Food Insecurity: Not on file  Transportation Needs: Not on file  Physical Activity: Not on file  Stress: Not on file  Social Connections: Not on file  Intimate Partner Violence: Not on file    Outpatient Medications Prior to Visit  Medication Sig Dispense Refill  . Cholecalciferol (DIALYVITE VITAMIN D 5000 PO) Take 2 capsules by mouth daily.    . Glucosamine-Chondroitin (GLUCOSAMINE CHONDR COMPLEX PO) Take 1 capsule by mouth daily.    . meloxicam (MOBIC) 15 MG tablet TAKE 1 TABLET BY MOUTH EVERY DAY 30 tablet 5  . Multiple Vitamin (MULTIVITAMIN) tablet Take 1 tablet by mouth daily.    . Turmeric (QC TUMERIC COMPLEX PO)  Take by mouth daily.     No facility-administered medications prior to visit.    No Known Allergies  ROS Review of Systems  Constitutional: Negative for appetite change, chills, fatigue and fever.  HENT: Negative for congestion, dental problem, ear pain and sore throat.   Eyes: Negative for discharge, redness and visual disturbance.  Respiratory: Negative for cough, chest tightness, shortness of breath and wheezing.   Cardiovascular: Negative for chest pain, palpitations and leg swelling.  Gastrointestinal: Negative for abdominal pain, blood in stool, diarrhea, nausea and vomiting.  Genitourinary: Negative for difficulty urinating, dysuria, flank pain, frequency, hematuria and urgency.  Musculoskeletal: Positive for arthralgias (L ankle, chronic). Negative for back pain, joint swelling, myalgias and neck stiffness.  Skin: Negative for pallor and rash.  Neurological: Negative for dizziness, speech difficulty, weakness and headaches.  Hematological: Negative for adenopathy. Does not bruise/bleed easily.  Psychiatric/Behavioral: Negative for confusion and sleep disturbance. The patient is not nervous/anxious.     PE; Vitals with BMI 01/04/2021 06/12/2020 01/24/2019  Height 5' 10.6" 5\' 11"  5\' 11"   Weight 312 lbs 13 oz 313 lbs 6 oz 302 lbs 3 oz  BMI 44.13 43.73 42.17  Systolic 162 136  Diastolic 84 87 77  Pulse 85 75 90     Gen: Alert, well appearing.  Patient is oriented to person, place, time, and situation. AFFECT: pleasant, lucid thought and speech. ENT: Ears: EACs clear, normal epithelium.  TMs with good light reflex and landmarks bilaterally.  Eyes: no injection, icteris, swelling, or exudate.  EOMI, PERRLA. Nose: no drainage or turbinate edema/swelling.  No injection or focal lesion.  Mouth: lips without lesion/swelling.  Oral mucosa pink and moist.  Dentition intact and without obvious caries or gingival swelling.  Oropharynx without erythema, exudate, or swelling.  Neck:  supple/nontender.  No LAD, mass, or TM.  Carotid pulses 2+ bilaterally, without bruits. CV: RRR, no m/r/g.   LUNGS: CTA bilat, nonlabored resps, good aeration in all lung fields. ABD: soft, NT, ND, BS normal.  No hepatospenomegaly or mass.  No bruits. EXT: no clubbing, cyanosis, or edema.  Musculoskeletal: no joint swelling, erythema, warmth, or tenderness.  ROM of all joints intact. Skin - no sores or suspicious lesions or rashes or color changes   Pertinent labs:  Lab Results  Component Value Date   TSH 1.75 01/11/2019   Lab Results  Component Value Date   WBC 6.8 01/11/2019   HGB 13.5 01/11/2019   HCT 41.2 01/11/2019   MCV 79.0 01/11/2019   PLT 244.0 01/11/2019   Lab Results  Component Value  Date   CREATININE 1.01 06/12/2020   BUN 19 06/12/2020   NA 139 06/12/2020   K 4.2 06/12/2020   CL 103 06/12/2020   CO2 28 06/12/2020   Lab Results  Component Value Date   ALT 19 06/12/2020   AST 17 06/12/2020   ALKPHOS 63 06/12/2020   BILITOT 0.8 06/12/2020   Lab Results  Component Value Date   CHOL 242 (H) 06/12/2020   Lab Results  Component Value Date   HDL 49.40 06/12/2020   Lab Results  Component Value Date   LDLCALC 174 (H) 06/12/2020   Lab Results  Component Value Date   TRIG 96.0 06/12/2020   Lab Results  Component Value Date   CHOLHDL 5 06/12/2020   Lab Results  Component Value Date   PSA 0.21 01/11/2019    ASSESSMENT AND PLAN:   1) HLD: He is ready to accept medication treatment at this time. Start atorva 20mg  qd. FLP and hepatic panel today.  2) IFG: needs to really start focusing on improving diet, particularly in light of inability to burn calories adequately due to L foot pain. Fasting gluc and a1c check today.  3) HTN: he accepts the fact that he needs to start med for this now. Start irbesartan 150 mg qd.   Lytes/cr today.  F/u 2 wks to review home bp's.  4) Health maintenance exam: Reviewed age and gender appropriate health  maintenance issues (prudent diet, regular exercise, health risks of tobacco and excessive alcohol, use of seatbelts, fire alarms in home, use of sunscreen).  Also reviewed age and gender appropriate health screening as well as vaccine recommendations. Vaccines: Flu->given today.  Covid->UTD.  Tdap UTD. Labs: cbc,cmet, flp, tsh, a1c, psa. Prostate ca screening: PSA today. Colon ca screening: due for recall as of 2021 2022 GI)->gave pt their contact # today.  5) Chronic L foot pain: benefits from meloxicam 15mg  qd. He is interested in resuming f/u with Dr. Deboraha Sprang in sports med--I gave pt Dr. office # today.  An After Visit Summary was printed and given to the patient.  FOLLOW UP:  No follow-ups on file.  Signed:  Berline Chough, MD           01/04/2021

## 2021-01-04 NOTE — Patient Instructions (Signed)
Call Dr. Janeece Riggers office to make follow up appt to discuss your foot and ankle pain:  [phone]   Call [facility] to arrange colonoscopy:  [phone]  Check your blood pressure and heart rate once a day and write the numbers down and bring them in to review with me in 2 weeks.

## 2021-01-04 NOTE — Addendum Note (Signed)
Addended by: Mertha Finders on: 01/04/2021 09:39 AM   Modules accepted: Orders

## 2021-01-11 NOTE — Progress Notes (Unsigned)
Subjective:    CC: L foot and ankle pain  I, Cristian Harris, LAT, ATC, am serving as scribe for Dr. [name].  HPI: Pt is a 55 y/o male presenting w/ chronic L foot and ankle pain that flare up about a year ago .  He locates his pain to medial aspect, arch, and across dorsal aspect .  He's been seen previously by Dr. Berline Chough on 12/24/18. Pt is to a point where the pain in changing his lifestyle and limiting the things wants to do.  Radiating pain: no L foot/ankle swelling: no Aggravating factors: walking, standing Treatments tried: Meloxicam, cream  Diagnostic imaging: L foot and ankle XR- 12/24/18  Pertinent review of Systems: No fevers or chills  Relevant historical information: Hyperlipidemia, hypertension   Objective:    Vitals:   01/12/21 0836  BP: (!) 146/98  Pulse: 87  SpO2: 96%  Body mass index is 45.5 kg/m.  General: Well Developed, well nourished, and in no acute distress.   MSK: Significant genu varus bilaterally. Left foot and ankle largely normal-appearing Nontender. Normal motion. Strength is intact. Stable ligamentous exam. Pulses cap refill and sensation are intact distally.  Lab and Radiology Results  X-ray images left foot and ankle obtained today personally and independently interpreted  Left foot: Mild midfoot DJD.  No fractures.  Left ankle: Medial DJD.  No fractures or malalignment.  Awaiting formal radiology review  Diagnostic Limited MSK Ultrasound of: Left foot and ankle medially Posterior tibialis tendon is intact with trace of hypoechoic fluid tracking around tendon and posterior to medial malleolus indicating mild tenosynovitis. Other medial ankle tendon structures are normal-appearing Medial ankle no effusion. Plantar fascia largely normal-appearing measuring under 0.4 cm in thickness. Impression: Mild posterior tibialis tendinitis    Impression and Recommendations:    Assessment and Plan: 55 y.o. male with medial ankle  and foot pain thought to be multifactorial.  Patient does have significant genu varus which may be contributory.  Ultrasound today does show some posterior tibialis tendinitis.  Plan to treat pragmatically and conservatively with home exercise program and Voltaren gel.  Additionally will screen for gout with uric acid.  Recheck back in 1 month.  If not better would consider MRI or injection as potential next options.Marland Kitchen  PDMP not reviewed this encounter. Orders Placed This Encounter  Procedures  . [place] LIMITED JOINT SPACE STRUCTURES LOW LEFT(NO LINKED CHARGES)    Standing Status:   Future    Number of Occurrences:   1    Standing Expiration Date:   07/12/2021    Order Specific Question:   Reason for Exam (SYMPTOM  OR DIAGNOSIS REQUIRED)    Answer:   chronic left foot pain    Order Specific Question:   Preferred imaging location?    Answer:   [facility]  . DG Ankle Complete Left    Standing Status:   Future    Number of Occurrences:   1    Standing Expiration Date:   01/12/2022    Order Specific Question:   Reason for Exam (SYMPTOM  OR DIAGNOSIS REQUIRED)    Answer:   eval medial ankle pain    Order Specific Question:   Preferred imaging location?    Answer:   [facility]  . DG Foot Complete Left    Standing Status:   Future    Number of Occurrences:   1    Standing Expiration Date:   01/12/2022    Order  Specific Question:   Reason for Exam (SYMPTOM  OR DIAGNOSIS REQUIRED)    Answer:   eval meidal foot pain    Order Specific Question:   Preferred imaging location?    Answer:   [facility]  . Uric acid    Standing Status:   Future    Number of Occurrences:   1    Standing Expiration Date:   01/12/2022   No orders of the defined types were placed in this encounter.   Discussed warning signs or symptoms. Please see discharge instructions. Patient expresses understanding.   The above documentation has been reviewed and is accurate and complete  [name], M.D.

## 2021-01-12 ENCOUNTER — Ambulatory Visit (INDEPENDENT_AMBULATORY_CARE_PROVIDER_SITE_OTHER): Payer: BC Managed Care – PPO

## 2021-01-12 ENCOUNTER — Ambulatory Visit: Payer: Self-pay

## 2021-01-12 ENCOUNTER — Encounter: Payer: Self-pay | Admitting: Family Medicine

## 2021-01-12 ENCOUNTER — Ambulatory Visit (INDEPENDENT_AMBULATORY_CARE_PROVIDER_SITE_OTHER): Payer: BC Managed Care – PPO | Admitting: Family Medicine

## 2021-01-12 ENCOUNTER — Other Ambulatory Visit: Payer: Self-pay

## 2021-01-12 VITALS — BP 146/98 | HR 87 | Ht 70.6 in | Wt 322.6 lb

## 2021-01-12 DIAGNOSIS — I10 Essential (primary) hypertension: Secondary | ICD-10-CM | POA: Insufficient documentation

## 2021-01-12 DIAGNOSIS — M25572 Pain in left ankle and joints of left foot: Secondary | ICD-10-CM

## 2021-01-12 DIAGNOSIS — M79672 Pain in left foot: Secondary | ICD-10-CM | POA: Diagnosis not present

## 2021-01-12 DIAGNOSIS — E785 Hyperlipidemia, unspecified: Secondary | ICD-10-CM | POA: Insufficient documentation

## 2021-01-12 DIAGNOSIS — G8929 Other chronic pain: Secondary | ICD-10-CM | POA: Diagnosis not present

## 2021-01-12 LAB — URIC ACID: Uric Acid, Serum: 6.7 mg/dL (ref 4.0–7.8)

## 2021-01-12 NOTE — Progress Notes (Signed)
Uric acid is mildly elevated indicating gout is a possibility.

## 2021-01-12 NOTE — Patient Instructions (Addendum)
Thank you for coming in today.  Please get an Xray today before you leave.  Please get labs today before you leave  Please use voltaren gel up to 4x daily for pain as needed.   Do the home exercises. View at [email or web address] using code: [id number]  Recheck in about 1 month.   Let me know if this is not working. We have more to do if you dont get better.

## 2021-01-13 NOTE — Progress Notes (Signed)
X-ray left ankle looks normal to radiology

## 2021-01-13 NOTE — Progress Notes (Signed)
X-ray left foot looks normal to radiology

## 2021-01-20 ENCOUNTER — Ambulatory Visit: Payer: BC Managed Care – PPO | Admitting: Family Medicine

## 2021-01-27 ENCOUNTER — Other Ambulatory Visit: Payer: Self-pay | Admitting: Family Medicine

## 2021-02-09 ENCOUNTER — Ambulatory Visit: Payer: BC Managed Care – PPO | Admitting: Family Medicine

## 2021-02-24 ENCOUNTER — Other Ambulatory Visit: Payer: Self-pay | Admitting: Family Medicine

## 2021-03-11 ENCOUNTER — Ambulatory Visit: Payer: BC Managed Care – PPO | Admitting: Family Medicine

## 2021-03-15 ENCOUNTER — Ambulatory Visit: Payer: BC Managed Care – PPO | Admitting: Family Medicine

## 2021-03-16 ENCOUNTER — Other Ambulatory Visit: Payer: Self-pay | Admitting: Family Medicine

## 2021-03-29 ENCOUNTER — Other Ambulatory Visit: Payer: Self-pay | Admitting: Family Medicine

## 2021-04-12 ENCOUNTER — Other Ambulatory Visit: Payer: Self-pay | Admitting: Family Medicine

## 2021-04-21 ENCOUNTER — Ambulatory Visit: Payer: BC Managed Care – PPO | Admitting: Family Medicine

## 2021-05-23 ENCOUNTER — Other Ambulatory Visit: Payer: Self-pay | Admitting: Family Medicine

## 2021-08-06 NOTE — Progress Notes (Signed)
I, Cristian Harris, LAT, ATC, am serving as scribe for Dr. Clementeen Harris.  Cristian Harris is a 55 y.o. male who presents to [facility] today for f/u of chronic L foot and ankle pain.  He was last seen by Dr. Denyse Harris on 01/12/21 and was shown a HEP for post tib dysfunction and was advised to use Voltaren gel.  He also had his uric acid level checked which was mildly elevated at  6.7 mg/dL. Today, pt reports foot pain has continued and worsened over the past week after walking a lot a the football game. Pt locates pain plantar aspect of L foot and medial aspect of L ankle.  Over the last 6 months and he was seen last and he has been completing his home exercise program.  He has been exercising at least 3 days a week for about 20 minutes working on foot and ankle strength and mobility.  Diagnostic testing: L foot and ankle XR- 01/12/21  Pertinent review of systems: No fevers or chills  Relevant historical information: Hypertension.  Hyperlipidemia.  Morbid obesity.   Exam:  BP (!) 164/104   Pulse 81   Ht 5' 10.6" (1.793 m)   Wt (!) 323 lb 6.4 oz (146.7 kg)   SpO2 96%   BMI 45.62 kg/m  General: Well Developed, well nourished, and in no acute distress.   MSK: Left foot and ankle normal-appearing Normal motion.   Mildly tender palpation along medial ankle.  Plantar foot is not particularly tender to palpation however he does have a significant antalgic gait with ambulation.    Lab and Radiology Results  EXAM: LEFT FOOT - COMPLETE 3+ VIEW   COMPARISON:  None.   FINDINGS: There is no evidence of fracture or dislocation. There is no evidence of arthropathy or other focal bone abnormality. Soft tissues are unremarkable.   IMPRESSION: Negative.     Electronically Signed   By: Cristian Harris M.D.   On: 01/12/2021 16:41  EXAM: LEFT ANKLE COMPLETE - 3+ VIEW   COMPARISON:  None.   FINDINGS: There is no evidence of fracture, dislocation, or joint  effusion. There is no evidence of arthropathy or other focal bone abnormality. Soft tissues are unremarkable.   IMPRESSION: Negative.     Electronically Signed   By: Cristian Harris M.D.   On: 01/12/2021 16:40  I, Cristian Harris, personally (independently) visualized and performed the interpretation of the images attached in this note.     Assessment and Plan: 55 y.o. male with exacerbation of left plantar foot and medial ankle pain.  This has been a chronic ongoing issue for years.  He saw me for this in February and did have some improvement with some reasonable home exercise program.  However he worsened acutely recently with pain along the plantar midfoot and at the lateral plantar foot along the fifth metatarsal.  At this point I think he is failing conservative management.  Plan to proceed with MRI to further characterize cause of pain.  MRI should evaluate for stress fracture which is one of the differential diagnoses as well as tendinopathy along the posterior tibialis tendon.  Additionally a limited prescription of meloxicam.  Recheck following MRI results.  Additionally for limited pain control now recommend cam walker boot and use as needed.   PDMP not reviewed this encounter. Orders Placed This Encounter  Procedures   MR FOOT LEFT WO CONTRAST    Standing Status:   Future  Standing Expiration Date:   08/09/2022    Order Specific Question:   What is the patient's sedation requirement?    Answer:   No Sedation    Order Specific Question:   Does the patient have a pacemaker or implanted devices?    Answer:   No    Order Specific Question:   Preferred imaging location?    Answer:   [facility] (table limit-350lbs)   Meds ordered this encounter  Medications   meloxicam (MOBIC) 15 MG tablet    Sig: Take 1 tablet (15 mg total) by mouth daily as needed for pain.    Dispense:  30 tablet    Refill:  3    OFFICE VISIT NEEDED     Discussed warning signs or symptoms.  Please see discharge instructions. Patient expresses understanding.   The above documentation has been reviewed and is accurate and complete Cristian Harris, M.D.

## 2021-08-09 ENCOUNTER — Ambulatory Visit (INDEPENDENT_AMBULATORY_CARE_PROVIDER_SITE_OTHER): Payer: BC Managed Care – PPO | Admitting: Family Medicine

## 2021-08-09 ENCOUNTER — Other Ambulatory Visit: Payer: Self-pay

## 2021-08-09 VITALS — BP 164/104 | HR 81 | Ht 70.6 in | Wt 323.4 lb

## 2021-08-09 DIAGNOSIS — M79672 Pain in left foot: Secondary | ICD-10-CM

## 2021-08-09 DIAGNOSIS — G8929 Other chronic pain: Secondary | ICD-10-CM

## 2021-08-09 DIAGNOSIS — M25572 Pain in left ankle and joints of left foot: Secondary | ICD-10-CM

## 2021-08-09 MED ORDER — MELOXICAM 15 MG PO TABS: 15.0000 mg | ORAL_TABLET | Freq: Every day | ORAL | 3 refills | Status: AC | PRN

## 2021-08-09 NOTE — Patient Instructions (Addendum)
Thank you for coming in today.   Use meloxicam as needed.   Ok to take with tylneol. Tyenol is safer.   Please use Voltaren gel (Generic Diclofenac Gel) up to 4x daily for pain as needed.  This is available over-the-counter as both the name brand Voltaren gel and the generic diclofenac gel.   Please go to [facility] supply to get the cam walker boot we talked about today. You may also be able to get it from [facility].    You should hear from MRI scheduling within 1 week. If you do not hear please let me know.    Recheck after the MRI.

## 2021-08-14 ENCOUNTER — Ambulatory Visit (INDEPENDENT_AMBULATORY_CARE_PROVIDER_SITE_OTHER): Payer: BC Managed Care – PPO

## 2021-08-14 ENCOUNTER — Other Ambulatory Visit: Payer: Self-pay

## 2021-08-14 DIAGNOSIS — M79672 Pain in left foot: Secondary | ICD-10-CM

## 2021-08-14 DIAGNOSIS — R600 Localized edema: Secondary | ICD-10-CM | POA: Diagnosis not present

## 2021-08-16 NOTE — Progress Notes (Signed)
MRI of the foot is significant for edema within one of the muscles of the foot which could indicate a strain of the muscle.  Fortunately no tendon tear or muscle tear or bone edema or fracture is visible.  No evidence of stress fracture.  Recommend return to clinic to go over the results in full detail and discuss next steps and treatment plan.

## 2021-08-17 NOTE — Progress Notes (Signed)
I, Christoper Fabian, LAT, ATC, am serving as scribe for Dr. [name].  Cristian Harris is a 55 y.o. male who presents to [facility] at [facility] today for f/u of chronic L foot and ankle pain and to review his L foot MRI results.  He was last seen by Dr. [name] on 08/09/21 w/ worsening foot pain after doing a lot of walking at a [employer] game.  He was referred for an MRI and prescribed meloxicam.  He was also advised to purchase a CAM walker boot.  Today, pt reports that his L foot feels about the same.  He states that he did purchase and wear the CAM walker boot that he only wore around the house for a few days.  He has been taking the Meloxicam prn and reports only having taken it once.  He takes Advil daily.  Patient has used some over-the-counter arch support/insoles but has not had custom orthotics.  Diagnostic testing: L foot MRI- 08/14/21; L foot and L ankle XR- 01/12/21  Pertinent review of systems: No fevers or chills  Relevant historical information: Hypertension.  Obesity.   Exam:  BP 140/84 (BP Location: Right Arm, Patient Position: Sitting, Cuff Size: Large)   Pulse 81   Ht 5' 10.5" (1.791 m)   Wt (!) 323 lb (146.5 kg)   SpO2 95%   BMI 45.69 kg/m  General: Well Developed, well nourished, and in no acute distress.   MSK: Left foot pes planus.  Tender palpation mid plantar foot.  Normal foot and ankle motion. Poor activation of intrinsic foot musculature with foot PT exercise teaching.  Patient had difficulty gripping a towel with his toes.    Lab and Radiology Results No results found for this or any previous visit (from the past 72 hour(s)). MR FOOT LEFT WO CONTRAST  Result Date: 08/16/2021 CLINICAL DATA:  Plantar left midfoot pain for 8 years EXAM: MRI OF THE LEFT FOOT WITHOUT CONTRAST TECHNIQUE: Multiplanar, multisequence MR imaging of the left midfoot was performed. No intravenous contrast was administered. COMPARISON:  X-ray 01/12/2021 FINDINGS:  Bones/Joint/Cartilage No acute fracture or malalignment. No bone marrow edema or periostitis. No significant arthropathy. No joint effusion. No erosion. No suspicious bone lesion. Ligaments Intact Lisfranc ligament. Remaining visualized ligamentous structures of the midfoot and forefoot are intact. Muscles and Tendons Flexor and extensor tendons of the midfoot and visualized forefoot are intact. No tenosynovitis. Mild intramuscular edema within the abductor hallucis muscle. Preserved muscle bulk without atrophy or fatty infiltration. Soft tissues No soft tissue edema or fluid collection. No evidence of a soft tissue mass. IMPRESSION: 1. Mild intramuscular edema within the abductor hallucis muscle, which may reflect muscle strain. 2. Otherwise, unremarkable MRI of the left midfoot. No evidence of a stress fracture. Electronically Signed   By: Duanne Guess D.O.   On: 08/16/2021 10:40   I, [name], personally (independently) visualized and performed the interpretation of the images attached in this note.     Assessment and Plan: 55 y.o. male with left foot pain.  This is an exacerbation of a chronic problem that is been occurring off and on for quite a while now.  His current episode started after he attempted to walk about a mile.  MRI surprisingly did not show a stress fracture or significant bone edema or degenerative changes in his midfoot or significant tear.  Simply it showed muscle edema of the intrinsic musculature of his plantar foot.   I think he may have  a good benefit from custom orthotics since he is not doing very well with the over-the-counter arch support.  Plan to refer to podiatry for orthotics as well as potential second opinion.  He may benefit from dedicated foot physical therapy as well.  Lastly taught patient some intrinsic foot musculature exercises in clinic today as part of home exercise program.   PDMP not reviewed this encounter. Orders Placed This Encounter  Procedures    Ambulatory referral to Podiatry    Referral Priority:   Routine    Referral Type:   Consultation    Referral Reason:   Specialty Services Required    Referred to Provider:   Vivi Barrack, DPM    Requested Specialty:   Podiatry    Number of Visits Requested:   1   No orders of the defined types were placed in this encounter.    Discussed warning signs or symptoms. Please see discharge instructions. Patient expresses understanding.   The above documentation has been reviewed and is accurate and complete [name], M.D.

## 2021-08-18 ENCOUNTER — Encounter: Payer: Self-pay | Admitting: Family Medicine

## 2021-08-18 ENCOUNTER — Other Ambulatory Visit: Payer: Self-pay

## 2021-08-18 ENCOUNTER — Ambulatory Visit (INDEPENDENT_AMBULATORY_CARE_PROVIDER_SITE_OTHER): Payer: BC Managed Care – PPO | Admitting: Family Medicine

## 2021-08-18 VITALS — BP 140/84 | HR 81 | Ht 70.5 in | Wt 323.0 lb

## 2021-08-18 DIAGNOSIS — M79672 Pain in left foot: Secondary | ICD-10-CM | POA: Diagnosis not present

## 2021-08-18 NOTE — Patient Instructions (Signed)
Thank you for coming in today.   You should hear from Podiatry soon.   I think orthotics should help as well.   Do the towel exercises 10-30 reps 2-3x daily.  Try to pull the towel with your toes.  Do the ice massage in the evening.  Rest the foot as needed.

## 2021-08-26 ENCOUNTER — Ambulatory Visit: Payer: BC Managed Care – PPO | Admitting: Podiatry

## 2021-09-02 ENCOUNTER — Ambulatory Visit: Payer: BC Managed Care – PPO | Admitting: Podiatry

## 2021-09-20 ENCOUNTER — Ambulatory Visit: Payer: BC Managed Care – PPO | Admitting: Podiatry

## 2022-07-21 DIAGNOSIS — H25813 Combined forms of age-related cataract, bilateral: Secondary | ICD-10-CM | POA: Diagnosis not present

## 2022-07-21 DIAGNOSIS — H40003 Preglaucoma, unspecified, bilateral: Secondary | ICD-10-CM | POA: Diagnosis not present

## 2023-03-09 ENCOUNTER — Encounter: Payer: Self-pay | Admitting: Family Medicine

## 2023-07-27 IMAGING — MR MR FOOT*L* W/O CM
4 of 5 series · 19 of 40 positions shown · non-contrast
Comparison: X-ray 01/12/2021

CLINICAL DATA: Plantar left midfoot pain for 8 years

EXAM:
MRI OF THE LEFT FOOT WITHOUT CONTRAST
TECHNIQUE: Multiplanar, multisequence MR imaging of the left midfoot was
performed. No intravenous contrast was administered.

[Series 4: T1 · coronal · 3.0mm · 0.23mm/px · 3 of 56 slices shown (1 of 2)]
[im 11/56]
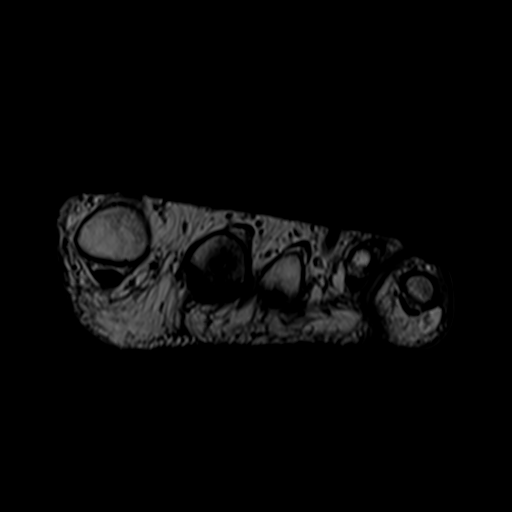
[im 31/56]
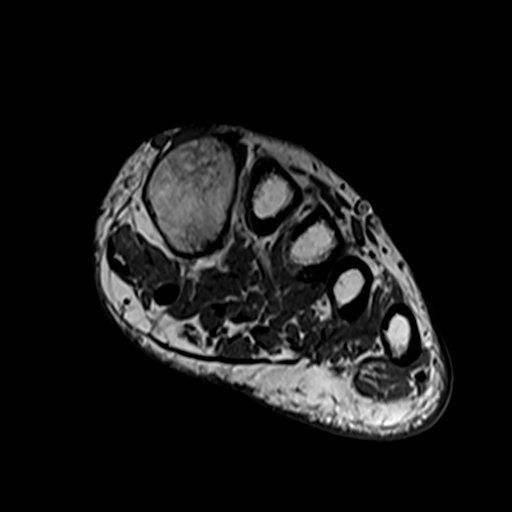
[im 51/56]
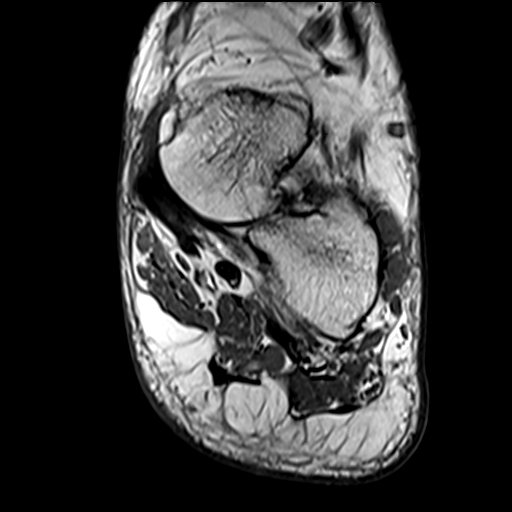

[Series 5: T2 fat-sat · coronal · 3.0mm · 0.23mm/px · 9 of 56 slices shown (1 of 2)]
[im 1/56]
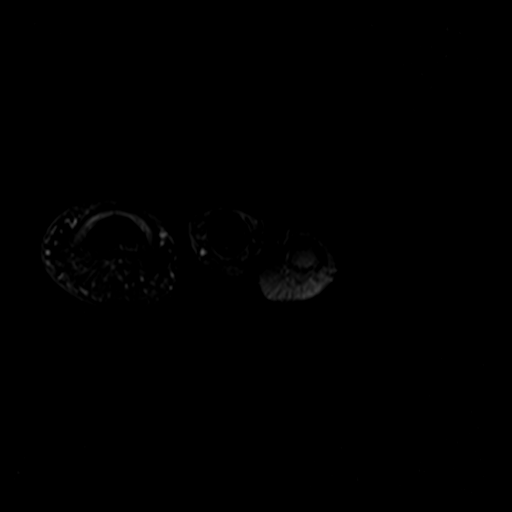
[im 11/56]
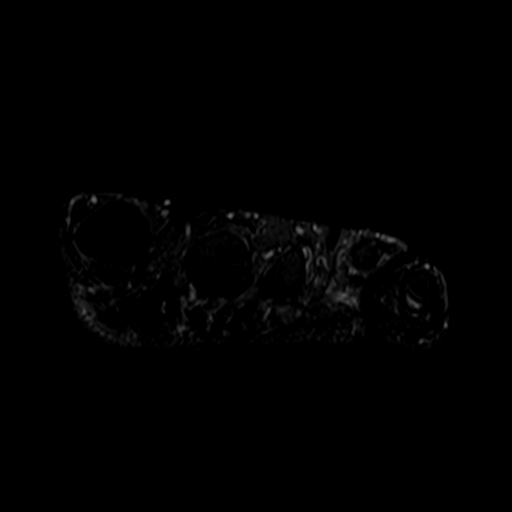
[im 16/56]
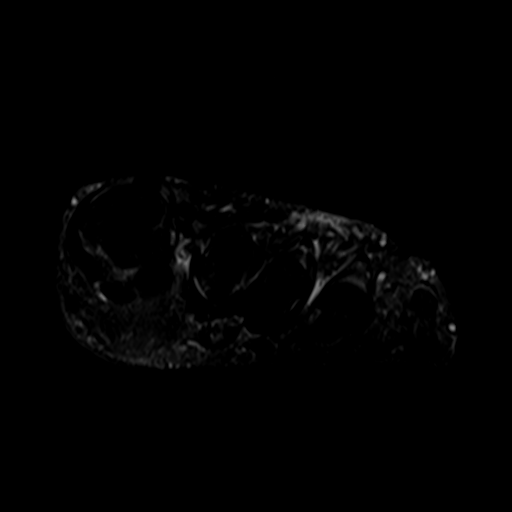
[im 26/56]
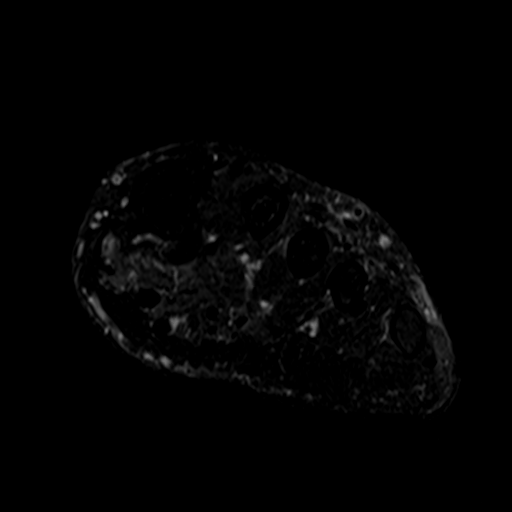
[im 31/56]
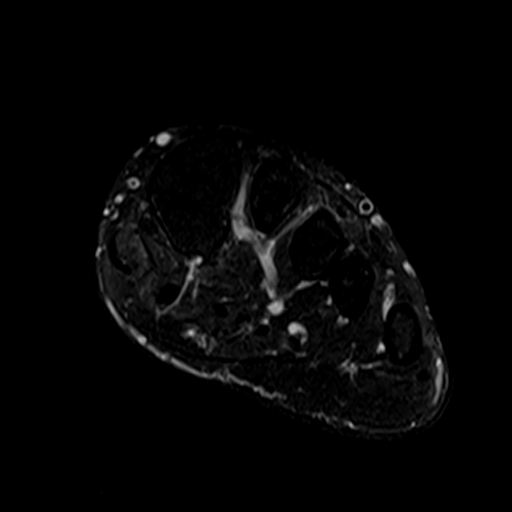
[im 41/56]
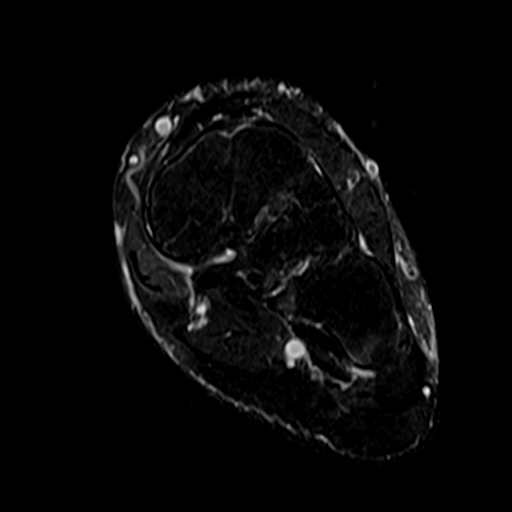
[im 46/56]
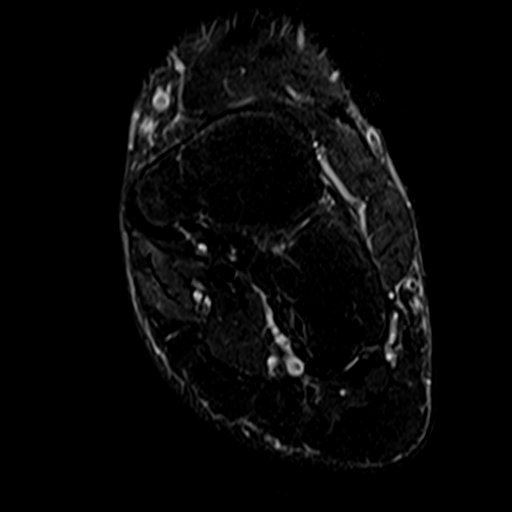
[im 51/56]
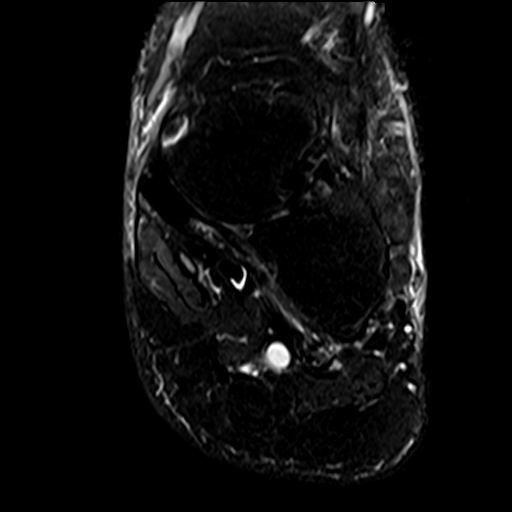
[im 56/56]
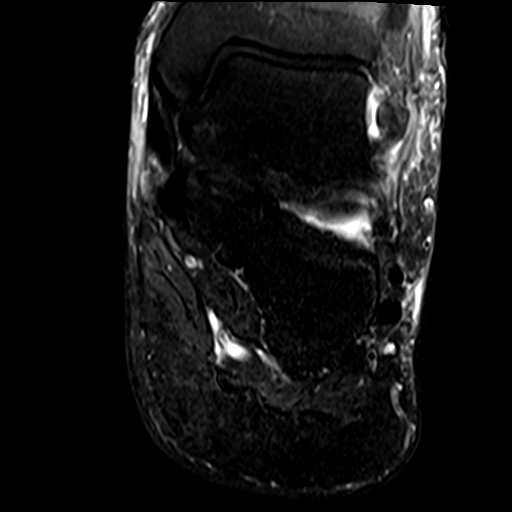

[Series 6: T1 · oblique · 3.0mm · 0.35mm/px · 3 of 26 slices shown (2 of 2)]
[im 1/26]
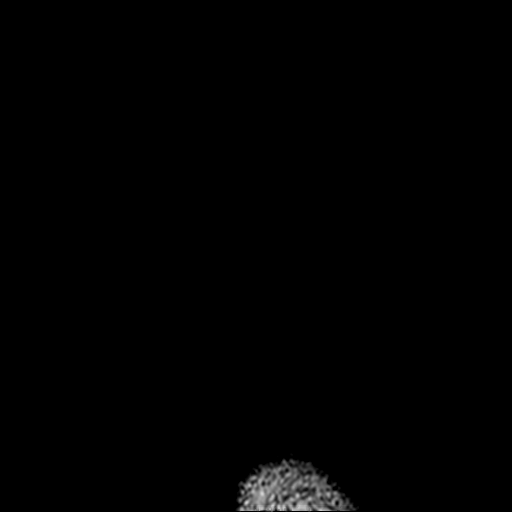
[im 13/26]
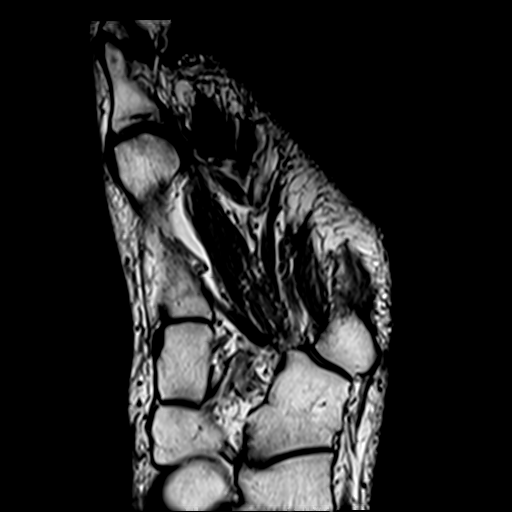
[im 26/26]
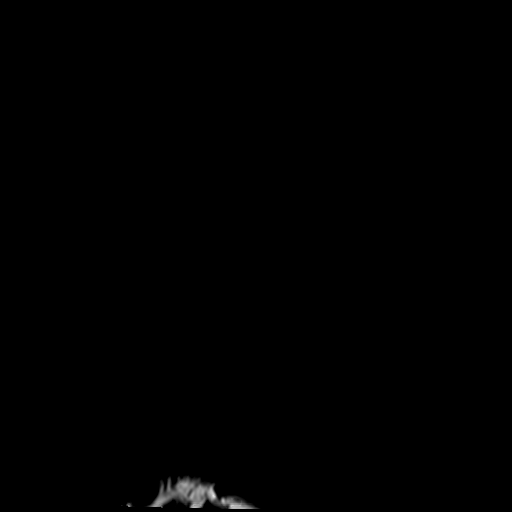

[Series 7: T2 fat-sat · oblique · 3.0mm · 0.35mm/px · 4 of 26 slices shown (2 of 2)]
[im 1/26]
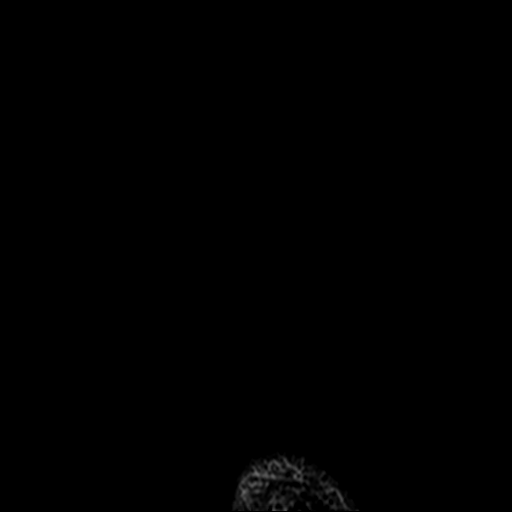
[im 7/26]
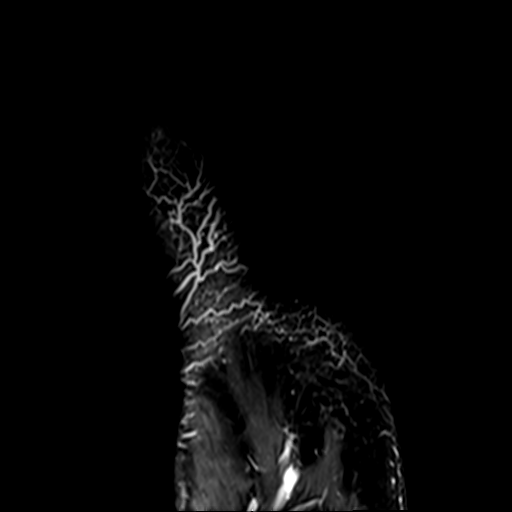
[im 13/26]
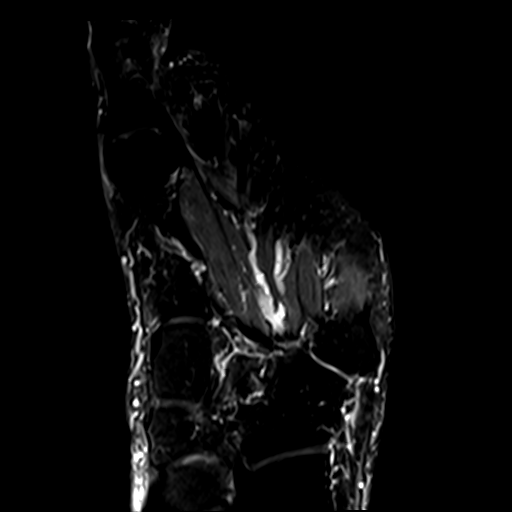
[im 26/26]
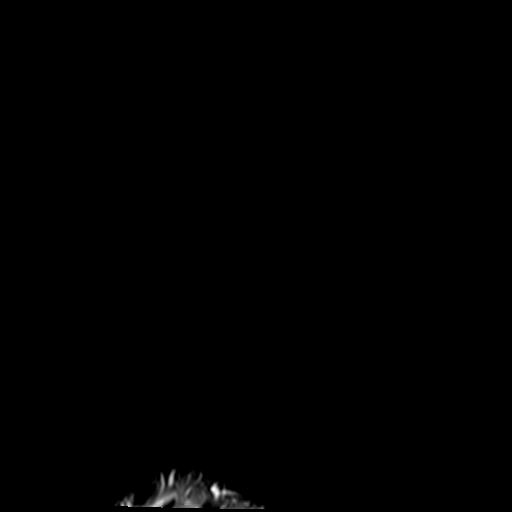

[19 of 40 positions shown; findings below may reference images not displayed]

FINDINGS: Bones/Joint/Cartilage

No acute fracture or malalignment. No bone marrow edema or
periostitis. No significant arthropathy. No joint effusion. No
erosion. No suspicious bone lesion.

Ligaments

Intact Lisfranc ligament. Remaining visualized ligamentous
structures of the midfoot and forefoot are intact.

Muscles and Tendons

Flexor and extensor tendons of the midfoot and visualized forefoot
are intact. No tenosynovitis. Mild intramuscular edema within the
abductor hallucis muscle. Preserved muscle bulk without atrophy or
fatty infiltration.

Soft tissues

No soft tissue edema or fluid collection. No evidence of a soft
tissue mass.
IMPRESSION: 1. Mild intramuscular edema within the abductor hallucis muscle,
which may reflect muscle strain.
2. Otherwise, unremarkable MRI of the left midfoot. No evidence of a
stress fracture.

## 2023-12-29 ENCOUNTER — Encounter: Payer: Self-pay | Admitting: Family Medicine

## 2023-12-29 ENCOUNTER — Ambulatory Visit (INDEPENDENT_AMBULATORY_CARE_PROVIDER_SITE_OTHER): Payer: BC Managed Care – PPO | Admitting: Family Medicine

## 2023-12-29 VITALS — BP 134/83 | HR 90 | Wt 310.4 lb

## 2023-12-29 DIAGNOSIS — R300 Dysuria: Secondary | ICD-10-CM | POA: Diagnosis not present

## 2023-12-29 DIAGNOSIS — N3001 Acute cystitis with hematuria: Secondary | ICD-10-CM | POA: Diagnosis not present

## 2023-12-29 DIAGNOSIS — Z23 Encounter for immunization: Secondary | ICD-10-CM

## 2023-12-29 LAB — POC URINALSYSI DIPSTICK (AUTOMATED)
Bilirubin, UA: NEGATIVE
Glucose, UA: NEGATIVE
Nitrite, UA: NEGATIVE
Protein, UA: POSITIVE — AB
Spec Grav, UA: 1.02 (ref 1.010–1.025)
Urobilinogen, UA: NEGATIVE U/dL — AB
pH, UA: 6 (ref 5.0–8.0)

## 2023-12-29 MED ORDER — SULFAMETHOXAZOLE-TRIMETHOPRIM 800-160 MG PO TABS: 1.0000 | ORAL_TABLET | Freq: Two times a day (BID) | ORAL | 0 refills | Status: AC

## 2023-12-29 NOTE — Progress Notes (Signed)
OFFICE VISIT  12/29/2023  CC:  Chief Complaint  Patient presents with   Dysuria    1 week; pain, burning when trying to urinate, urinary frequency.     Patient is a 58 y.o. male who presents for painful urination.  HPI: About 6 days ago he started to feel constipated. He then started to feel burning when urinating and going a little more urgently and frequently.  He gave himself some laxative and had good evacuation.  His urinary symptoms have persisted. He has never had a UTI. He has chronic nocturia, typically every 1-2 hours.  Daytime he does not have any significant problem with poor stream/dribbling/urinary urgency/incomplete emptying, etc.  No fevers, no visible blood in urine.  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 Berline Chough did steroid injection and is doing orthotics as of 12/2018-->improved.   Essential hypertension    Family history of prostate cancer in father    Hypercholesterolemia    statin started 12/2020   Morbid obesity (HCC)    Prediabetes 05/2020   a1c 6% 12/2020   Primary osteoarthritis of right knee     Past Surgical History:  Procedure Laterality Date   COLONOSCOPY  05/28/2015   [facility] per pt recall 5 years     Outpatient Medications Prior to Visit  Medication Sig Dispense Refill   atorvastatin (LIPITOR) 20 MG tablet TAKE 1 TABLET BY MOUTH EVERY DAY (Patient not taking: Reported on 12/29/2023) 14 tablet 0   Cholecalciferol (DIALYVITE VITAMIN D 5000 PO) Take 2 capsules by mouth daily. (Patient not taking: Reported on 12/29/2023)     Glucosamine-Chondroitin (GLUCOSAMINE CHONDR COMPLEX PO) Take 1 capsule by mouth daily. (Patient not taking: Reported on 12/29/2023)     irbesartan (AVAPRO) 150 MG tablet TAKE 1 TABLET BY MOUTH EVERY DAY (Patient not taking: Reported on 12/29/2023) 30 tablet 0   meloxicam (MOBIC) 15 MG tablet Take 1  tablet (15 mg total) by mouth daily as needed for pain. (Patient not taking: Reported on 12/29/2023) 30 tablet 3   Multiple Vitamin (MULTIVITAMIN) tablet Take 1 tablet by mouth daily. (Patient not taking: Reported on 12/29/2023)     Turmeric (QC TUMERIC COMPLEX PO) Take by mouth daily. (Patient not taking: Reported on 12/29/2023)     No facility-administered medications prior to visit.    No Known Allergies  Review of Systems  As per HPI  PE:    12/29/2023    8:13 AM 08/18/2021    9:32 AM 08/09/2021   10:37 AM  Vitals with BMI  Height  5' 10.5" 5' 10.6"  Weight 310 lbs 6 oz 323 lbs 323 lbs 6 oz  BMI  45.68 45.63  Systolic 134 140 161  Diastolic 83 84 104  Pulse 90 81 81     Physical Exam  Gen: Alert, well appearing.  Patient is oriented to person, place, time, and situation. AFFECT: pleasant, lucid thought and speech. No further exam today.  LABS:  Last CBC Lab Results  Component Value Date   WBC 6.9 01/04/2021   HGB 13.3 01/04/2021   HCT 40.0 01/04/2021   MCV 80.5 01/04/2021   RDW 15.1 01/04/2021   PLT 196.0 01/04/2021   Last metabolic panel Lab Results  Component Value Date   GLUCOSE 103 (H) 01/04/2021   NA 139 01/04/2021   K 4.1 01/04/2021   CL 105 01/04/2021  CO2 27 01/04/2021   BUN 13 01/04/2021   CREATININE 0.93 01/04/2021   GFR 91.69 01/04/2021   CALCIUM 9.6 01/04/2021   PROT 6.7 01/04/2021   ALBUMIN 4.2 01/04/2021   BILITOT 0.8 01/04/2021   ALKPHOS 59 01/04/2021   AST 16 01/04/2021   ALT 20 01/04/2021   Last hemoglobin A1c Lab Results  Component Value Date   HGBA1C 6.0 01/04/2021   IMPRESSION AND PLAN:  Acute UTI with microhematuria. Dipstick urinalysis today showed 3+ blood, 3+ leukocytes, 2+ protein, specific gravity 1.020.  Otherwise negative. Will treat with Bactrim double strength 1 twice daily x 5 days. Urine specimen sent for culture.  An After Visit Summary was printed and given to the patient.  FOLLOW UP: Return for 4 to 5 days  if not feeling resolution of his symptoms..  Signed:  Santiago Bumpers, MD           12/29/2023

## 2023-12-31 ENCOUNTER — Encounter: Payer: Self-pay | Admitting: Family Medicine

## 2023-12-31 LAB — URINE CULTURE
MICRO NUMBER:: 16026005
SPECIMEN QUALITY:: ADEQUATE
